# Patient Record
Sex: Male | Born: 1990 | Race: White | Hispanic: Yes | Marital: Single | Smoking: Former smoker
Health system: Southern US, Community
[De-identification: ages and names within clinical notes are randomized; demographics above are authoritative.]

## PROBLEM LIST (undated history)

## (undated) DIAGNOSIS — K760 Fatty (change of) liver, not elsewhere classified: Secondary | ICD-10-CM

## (undated) DIAGNOSIS — F1010 Alcohol abuse, uncomplicated: Secondary | ICD-10-CM

## (undated) DIAGNOSIS — E559 Vitamin D deficiency, unspecified: Secondary | ICD-10-CM

## (undated) HISTORY — PX: APPENDECTOMY: SHX54

---

## 2015-03-10 ENCOUNTER — Ambulatory Visit: Payer: Self-pay | Admitting: Physician Assistant

## 2015-05-19 ENCOUNTER — Ambulatory Visit
Admission: EM | Admit: 2015-05-19 | Discharge: 2015-05-19 | Disposition: A | Discharge status: Home / Self Care | Payer: PRIVATE HEALTH INSURANCE | Attending: Family Medicine | Admitting: Family Medicine

## 2015-05-19 DIAGNOSIS — K529 Noninfective gastroenteritis and colitis, unspecified: Secondary | ICD-10-CM

## 2015-05-19 MED ORDER — ONDANSETRON 8 MG PO TBDP: 8.0000 mg | ORAL_TABLET | Freq: Two times a day (BID) | ORAL | Status: DC

## 2015-05-19 MED ORDER — ONDANSETRON 8 MG PO TBDP
8.0000 mg | ORAL_TABLET | Freq: Once | ORAL | Status: AC
  Administered 2015-05-19: 8 mg via ORAL

## 2015-05-19 NOTE — ED Notes (Signed)
C.o nausea and vomiting x 2 days. Vomited 4 times yesterday and twice today. No blood in emesis. Still feels nauseous right now. Generalized intermittent abdominal pain . No diarrhea.

## 2015-05-19 NOTE — ED Provider Notes (Signed)
CSN: [id number]     Arrival date & time 05/19/15  1725 History   First MD Initiated Contact with Patient 05/19/15 1845     Chief Complaint  Patient presents with  . Nausea  . Emesis   (Consider location/radiation/quality/duration/timing/severity/associated sxs/prior Treatment) Patient is a 24 y.o. male presenting with vomiting. The history is provided by the patient.  Emesis Severity:  Mild Duration:  2 days Timing:  Intermittent Number of daily episodes:  4 Quality:  Stomach contents Able to tolerate:  Liquids (water and ginger ale) Progression:  Unchanged Chronicity:  New Associated symptoms: diarrhea   Associated symptoms: no abdominal pain, no chills and no fever   Diarrhea:    Quality:  Watery   Number of occurrences:  5   Severity:  Mild   Duration:  2 days   Timing:  Intermittent   Progression:  Unchanged Risk factors: sick contacts (co-worker with similar symptoms last week)   Risk factors: no suspect food intake and no travel to endemic areas     No past medical history on file. Past Surgical History  Procedure Laterality Date  . Appendectomy     No family history on file. History  Substance Use Topics  . Smoking status: Current Every Day Smoker    Types: Cigarettes  . Smokeless tobacco: Not on file  . Alcohol Use: Yes    Review of Systems  Constitutional: Negative for chills.  Gastrointestinal: Positive for vomiting and diarrhea. Negative for abdominal pain.    Allergies  Review of patient's allergies indicates not on file.  Home Medications   Prior to Admission medications   Medication Sig Start Date End Date Taking? Authorizing Provider  ondansetron (ZOFRAN ODT) 8 MG disintegrating tablet Take 1 tablet (8 mg total) by mouth 2 (two) times daily. 05/19/15   Payton Mccallumrlando Kelwin Gibler, MD   BP 148/98 mmHg  Pulse 87  Temp(Src) 97.9 F (36.6 C) (Oral)  Resp 18  Ht 5\' 7"  (1.702 m)  Wt 245 lb (111.131 kg)  BMI 38.36 kg/m2  SpO2 98% Physical Exam   Constitutional: He appears well-developed and well-nourished. No distress.  Neck: Neck supple.  Cardiovascular: Normal rate, regular rhythm and normal heart sounds.   Pulmonary/Chest: Effort normal and breath sounds normal. No respiratory distress. He has no wheezes. He has no rales.  Abdominal: Soft. Bowel sounds are normal. He exhibits no distension and no mass. There is tenderness (mild epigastric tenderness; no rebound or guarding). There is no rebound and no guarding.  Neurological: He is alert.  Skin: Skin is warm. He is not diaphoretic.  Nursing note and vitals reviewed.   ED Course  Procedures (including critical care time) Labs Review Labs Reviewed - No data to display  Imaging Review No results found.   MDM   1. Acute gastroenteritis    Discharge Medication List as of 05/19/2015  7:38 PM    START taking these medications   Details  ondansetron (ZOFRAN ODT) 8 MG disintegrating tablet Take 1 tablet (8 mg total) by mouth 2 (two) times daily., Starting 05/19/2015, Until Discontinued, Normal      Plan: 1.Diagnosis reviewed with patient 2. rx as per orders; risks, benefits, potential side effects reviewed with patient 3. Recommend supportive treatment with clear liquids then advance slowly as tolerated 4. F/u prn if symptoms worsen or don't improve    Payton Mccallumrlando Niki Payment, MD 05/19/15 1940

## 2015-07-06 ENCOUNTER — Emergency Department: Payer: PRIVATE HEALTH INSURANCE

## 2015-07-06 ENCOUNTER — Encounter: Payer: Self-pay | Admitting: Emergency Medicine

## 2015-07-06 ENCOUNTER — Emergency Department
Admission: EM | Admit: 2015-07-06 | Discharge: 2015-07-06 | Disposition: A | Discharge status: Home / Self Care | Payer: PRIVATE HEALTH INSURANCE | Attending: Emergency Medicine | Admitting: Emergency Medicine

## 2015-07-06 ENCOUNTER — Other Ambulatory Visit: Payer: Self-pay

## 2015-07-06 DIAGNOSIS — R110 Nausea: Secondary | ICD-10-CM

## 2015-07-06 DIAGNOSIS — Z720 Tobacco use: Secondary | ICD-10-CM | POA: Diagnosis not present

## 2015-07-06 DIAGNOSIS — Z79899 Other long term (current) drug therapy: Secondary | ICD-10-CM | POA: Insufficient documentation

## 2015-07-06 DIAGNOSIS — R42 Dizziness and giddiness: Secondary | ICD-10-CM | POA: Diagnosis not present

## 2015-07-06 DIAGNOSIS — R112 Nausea with vomiting, unspecified: Secondary | ICD-10-CM | POA: Diagnosis not present

## 2015-07-06 LAB — URINALYSIS COMPLETE WITH MICROSCOPIC (ARMC ONLY)
BACTERIA UA: NONE SEEN
Bilirubin Urine: NEGATIVE
GLUCOSE, UA: NEGATIVE mg/dL
HGB URINE DIPSTICK: NEGATIVE
KETONES UR: NEGATIVE mg/dL
Leukocytes, UA: NEGATIVE
Nitrite: NEGATIVE
PH: 7 (ref 5.0–8.0)
Protein, ur: 30 mg/dL — AB
RBC / HPF: NONE SEEN RBC/hpf (ref 0–5)
Specific Gravity, Urine: 1.026 (ref 1.005–1.030)
Squamous Epithelial / LPF: NONE SEEN

## 2015-07-06 LAB — CBC WITH DIFFERENTIAL/PLATELET
BASOS ABS: 0.1 10*3/uL (ref 0–0.1)
BASOS PCT: 2 %
EOS ABS: 0.3 10*3/uL (ref 0–0.7)
EOS PCT: 6 %
HCT: 48 % (ref 40.0–52.0)
Hemoglobin: 16 g/dL (ref 13.0–18.0)
Lymphocytes Relative: 39 %
Lymphs Abs: 1.7 10*3/uL (ref 1.0–3.6)
MCH: 31.4 pg (ref 26.0–34.0)
MCHC: 33.4 g/dL (ref 32.0–36.0)
MCV: 93.9 fL (ref 80.0–100.0)
Monocytes Absolute: 0.4 10*3/uL (ref 0.2–1.0)
Monocytes Relative: 10 %
Neutro Abs: 1.9 10*3/uL (ref 1.4–6.5)
Neutrophils Relative %: 43 %
Platelets: 249 10*3/uL (ref 150–440)
RBC: 5.11 MIL/uL (ref 4.40–5.90)
RDW: 13.1 % (ref 11.5–14.5)
WBC: 4.4 10*3/uL (ref 3.8–10.6)

## 2015-07-06 LAB — BASIC METABOLIC PANEL
ANION GAP: 12 (ref 5–15)
BUN: 14 mg/dL (ref 6–20)
CO2: 27 mmol/L (ref 22–32)
CREATININE: 0.76 mg/dL (ref 0.61–1.24)
Calcium: 8.9 mg/dL (ref 8.9–10.3)
Chloride: 102 mmol/L (ref 101–111)
GFR calc Af Amer: >60 mL/min (ref >60)
GFR calc non Af Amer: >60 mL/min (ref >60)
Glucose, Bld: 137 mg/dL — ABNORMAL HIGH (ref 65–99)
Potassium: 3.6 mmol/L (ref 3.5–5.1)
Sodium: 141 mmol/L (ref 135–145)

## 2015-07-06 LAB — HEPATIC FUNCTION PANEL
ALBUMIN: 4.4 g/dL (ref 3.5–5.0)
ALT: 68 U/L — ABNORMAL HIGH (ref 17–63)
AST: 64 U/L — ABNORMAL HIGH (ref 15–41)
Alkaline Phosphatase: 59 U/L (ref 38–126)
BILIRUBIN INDIRECT: 0.4 mg/dL (ref 0.3–0.9)
Bilirubin, Direct: 0.1 mg/dL (ref 0.1–0.5)
Total Bilirubin: 0.5 mg/dL (ref 0.3–1.2)
Total Protein: 7.6 g/dL (ref 6.5–8.1)

## 2015-07-06 LAB — TROPONIN I: Troponin I: <0.03 ng/mL (ref <0.031)

## 2015-07-06 LAB — LIPASE, BLOOD: Lipase: 31 U/L (ref 22–51)

## 2015-07-06 MED ORDER — PROMETHAZINE HCL 25 MG/ML IJ SOLN
25.0000 mg | Freq: Once | INTRAMUSCULAR | Status: AC
  Administered 2015-07-06: 25 mg via INTRAVENOUS

## 2015-07-06 MED ORDER — PROMETHAZINE HCL 25 MG/ML IJ SOLN
INTRAMUSCULAR | Status: AC
  Filled 2015-07-06: qty 1

## 2015-07-06 MED ORDER — PROMETHAZINE HCL 25 MG PO TABS: 25.0000 mg | ORAL_TABLET | Freq: Four times a day (QID) | ORAL | Status: DC | PRN

## 2015-07-06 MED ORDER — ASPIRIN 81 MG PO CHEW
CHEWABLE_TABLET | ORAL | Status: AC
  Filled 2015-07-06: qty 4

## 2015-07-06 MED ORDER — KETOROLAC TROMETHAMINE 30 MG/ML IJ SOLN
INTRAMUSCULAR | Status: AC
  Filled 2015-07-06: qty 1

## 2015-07-06 MED ORDER — SODIUM CHLORIDE 0.9 % IV BOLUS (SEPSIS)
1000.0000 mL | Freq: Once | INTRAVENOUS | Status: AC
  Administered 2015-07-06: 1000 mL via INTRAVENOUS

## 2015-07-06 MED ORDER — ONDANSETRON HCL 4 MG/2ML IJ SOLN
INTRAMUSCULAR | Status: AC
  Filled 2015-07-06: qty 2

## 2015-07-06 MED ORDER — SODIUM CHLORIDE 0.9 % IV SOLN
1000.0000 mL | Freq: Once | INTRAVENOUS | Status: AC
  Administered 2015-07-06: 1000 mL via INTRAVENOUS

## 2015-07-06 MED ORDER — ONDANSETRON HCL 4 MG/2ML IJ SOLN
4.0000 mg | Freq: Once | INTRAMUSCULAR | Status: AC
  Administered 2015-07-06: 4 mg via INTRAVENOUS

## 2015-07-06 NOTE — Discharge Instructions (Signed)
Nausea and Vomiting °Nausea is a sick feeling that often comes before throwing up (vomiting). Vomiting is a reflex where stomach contents come out of your mouth. Vomiting can cause severe loss of body fluids (dehydration). Children and elderly adults can become dehydrated quickly, especially if they also have diarrhea. Nausea and vomiting are symptoms of a condition or disease. It is important to find the cause of your symptoms. °CAUSES  °· Direct irritation of the stomach lining. This irritation can result from increased acid production (gastroesophageal reflux disease), infection, food poisoning, taking certain medicines (such as nonsteroidal anti-inflammatory drugs), alcohol use, or tobacco use. °· Signals from the brain. These signals could be caused by a headache, heat exposure, an inner ear disturbance, increased pressure in the brain from injury, infection, a tumor, or a concussion, pain, emotional stimulus, or metabolic problems. °· An obstruction in the gastrointestinal tract (bowel obstruction). °· Illnesses such as diabetes, hepatitis, gallbladder problems, appendicitis, kidney problems, cancer, sepsis, atypical symptoms of a heart attack, or eating disorders. °· Medical treatments such as chemotherapy and radiation. °· Receiving medicine that makes you sleep (general anesthetic) during surgery. °DIAGNOSIS °Your caregiver may ask for tests to be done if the problems do not improve after a few days. Tests may also be done if symptoms are severe or if the reason for the nausea and vomiting is not clear. Tests may include: °· Urine tests. °· Blood tests. °· Stool tests. °· Cultures (to look for evidence of infection). °· X-rays or other imaging studies. °Test results can help your caregiver make decisions about treatment or the need for additional tests. °TREATMENT °You need to stay well hydrated. Drink frequently but in small amounts. You may wish to drink water, sports drinks, clear broth, or eat frozen  ice pops or gelatin dessert to help stay hydrated. When you eat, eating slowly may help prevent nausea. There are also some antinausea medicines that may help prevent nausea. °HOME CARE INSTRUCTIONS  °· Take all medicine as directed by your caregiver. °· If you do not have an appetite, do not force yourself to eat. However, you must continue to drink fluids. °· If you have an appetite, eat a normal diet unless your caregiver tells you differently. °¨ Eat a variety of complex carbohydrates (rice, wheat, potatoes, bread), lean meats, yogurt, fruits, and vegetables. °¨ Avoid high-fat foods because they are more difficult to digest. °· Drink enough water and fluids to keep your urine clear or pale yellow. °· If you are dehydrated, ask your caregiver for specific rehydration instructions. Signs of dehydration may include: °¨ Severe thirst. °¨ Dry lips and mouth. °¨ Dizziness. °¨ Dark urine. °¨ Decreasing urine frequency and amount. °¨ Confusion. °¨ Rapid breathing or pulse. °SEEK IMMEDIATE MEDICAL CARE IF:  °· You have blood or brown flecks (like coffee grounds) in your vomit. °· You have black or bloody stools. °· You have a severe headache or stiff neck. °· You are confused. °· You have severe abdominal pain. °· You have chest pain or trouble breathing. °· You do not urinate at least once every 8 hours. °· You develop cold or clammy skin. °· You continue to vomit for longer than 24 to 48 hours. °· You have a fever. °MAKE SURE YOU:  °· Understand these instructions. °· Will watch your condition. °· Will get help right away if you are not doing well or get worse. °Document Released: 12/13/2005 Document Revised: 03/06/2012 Document Reviewed: 05/12/2011 °ExitCare® Patient Information ©2015 [employer]. This information is not intended   to replace advice given to you by your health care provider. Make sure you discuss any questions you have with your health care provider.    Please take nausea medication as needed, as  written. Please follow up their primary care doctor this week for recheck. Please drink plenty of fluids of the next several days. Return to the emergency department for any abdominal pain, chest pain, fever, or any other symptom personally concerning to yourself.  As we have discussed please call the number provided for cardiology to arrange a follow-up appointment regarding today's EKG. Again this does not appear to be a concerning finding, this should be further evaluated in the near future. A copy of her EKG has been provided, please bring this to cardiology with you.

## 2015-07-06 NOTE — ED Provider Notes (Addendum)
[facility] Emergency Department Provider Note  Time seen: 7:22 AM  I have reviewed the triage vital signs and the nursing notes.   HISTORY  Chief Complaint Emesis and Dizziness    HPI Luis Richards is a 24 y.o. male with no past medical history who presents to the emergency department with nausea and vomiting 3 days. According to the patient he has been having frequent vomiting, unable to keep down fluids so came to the emergency department. Denies diarrhea, bloody stool or vomit, fever, dysuria, abdominal pain. Patient states constant nausea, but denies other symptoms. Describes his nausea as severe.     History reviewed. No pertinent past medical history.  There are no active problems to display for this patient.   Past Surgical History  Procedure Laterality Date  . Appendectomy      Current Outpatient Rx  Name  Route  Sig  Dispense  Refill  . ondansetron (ZOFRAN ODT) 8 MG disintegrating tablet   Oral   Take 1 tablet (8 mg total) by mouth 2 (two) times daily.   6 tablet   0     Allergies Review of patient's allergies indicates no known allergies.  History reviewed. No pertinent family history.  Social History History  Substance Use Topics  . Smoking status: Current Every Day Smoker    Types: Cigarettes  . Smokeless tobacco: Never Used  . Alcohol Use: Yes     Comment: occasional use    Review of Systems Constitutional: Negative for fever. Cardiovascular: Negative for chest pain. Respiratory: Negative for shortness of breath. Gastrointestinal: Negative for abdominal pain. Positive for nausea Genitourinary: Negative for dysuria. Neurological: Negative for headache  10-point ROS otherwise negative.  ____________________________________________   PHYSICAL EXAM:  VITAL SIGNS: ED Triage Vitals  Enc Vitals Group     BP 07/06/15 0649 133/96 mmHg     Pulse Rate 07/06/15 0649 100     Resp 07/06/15 0649 18     Temp  07/06/15 0649 98.3 F (36.8 C)     Temp Source 07/06/15 0649 Oral     SpO2 07/06/15 0649 97 %     Weight 07/06/15 0649 250 lb (113.399 kg)     Height 07/06/15 0649  (1.702 m)     Head Cir --      Peak Flow --      Pain Score 07/06/15 0650 7     Pain Loc --      Pain Edu? --      Excl. in GC? --     Constitutional: Alert and oriented. Well appearing and in no distress. ENT   Mouth/Throat: Mucous membranes are moist. Cardiovascular: Normal rate, regular rhythm. No murmur Respiratory: Normal respiratory effort without tachypnea nor retractions. Breath sounds are clear and equal bilaterally.  Gastrointestinal: Soft, mild epigastric tenderness to palpation. No rebound or guarding. No distention. Musculoskeletal: Nontender with normal range of motion in all extremities.  Neurologic:  Normal speech and language. No gross focal neurologic deficits  Skin:  Skin is warm, dry and intact.  Psychiatric: Mood and affect are normal. Speech and behavior are normal.   ____________________________________________    INITIAL IMPRESSION / ASSESSMENT AND PLAN / ED COURSE  Pertinent labs & imaging results that were available during my care of the patient were reviewed by me and considered in my medical decision making (see chart for details).  Patient with persistent nausea and vomiting 3 days. Denies any pain symptoms. Denies diarrhea, fever. We will  check labs, treat nausea, IV hydrate while awaiting lab results.  Labs have resulted showing normal results. Patient states he feels much better after Zofran. We will continue with IV hydration, and discharge the patient home on Phenergan as he was unable to afford Zofran the last time he was prescribed.   EKG reviewed and interpreted by myself shows normal sinus rhythm at 86 bpm, narrow QRS, normal axis, normal intervals, nonspecific ST changes present. No ST elevations noted. 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 No QRSs originate from this ectopic foci. Does not appear to be of a concerning nature, but I will recommend the patient follow up with cardiology for further evaluation.  Given mild LFT elevation, ultrasound was performed which shows fatty infiltrates in the liver, otherwise within normal limits. We will discharge the patient on Phenergan with primary care follow-up and cardiology follow-up I discussed this with the patient is agreeable.  ____________________________________________   FINAL CLINICAL IMPRESSION(S) / ED DIAGNOSES  Nausea and vomiting   Minna AntisKevin Avina Eberle, MD 07/06/15 [id number]  Minna AntisKevin Junior Huezo, MD 07/06/15 [id number]  Minna AntisKevin Kamorie Aldous, MD 07/06/15 1054

## 2015-07-06 NOTE — ED Notes (Signed)
Pt discharged home after verbalizing understanding of discharge instructions; nad noted. 

## 2015-07-06 NOTE — ED Notes (Addendum)
Pt c/o vomiting since Thursday; unable to keep anything down; last vomited just pta; generalized abd pain; denies diarrhea; last BM was yesterday-normal; pt pale in triage; pt seen here the end of May for similar symptoms; was prescribed Zofran but due to finances that was never filled

## 2015-07-08 ENCOUNTER — Ambulatory Visit
Admission: EM | Admit: 2015-07-08 | Discharge: 2015-07-08 | Disposition: A | Discharge status: Home / Self Care | Payer: PRIVATE HEALTH INSURANCE | Attending: Family Medicine | Admitting: Family Medicine

## 2015-07-08 DIAGNOSIS — K529 Noninfective gastroenteritis and colitis, unspecified: Secondary | ICD-10-CM | POA: Diagnosis not present

## 2015-07-08 MED ORDER — ONDANSETRON 8 MG PO TBDP: 8.0000 mg | ORAL_TABLET | Freq: Four times a day (QID) | ORAL | Status: DC | PRN

## 2015-07-08 MED ORDER — ONDANSETRON 8 MG PO TBDP: 8.0000 mg | ORAL_TABLET | Freq: Two times a day (BID) | ORAL | Status: DC

## 2015-07-08 MED ORDER — ONDANSETRON 8 MG PO TBDP
8.0000 mg | ORAL_TABLET | Freq: Once | ORAL | Status: AC
  Administered 2015-07-08: 8 mg via ORAL

## 2015-07-08 NOTE — ED Notes (Signed)
Pt states "I have been vomiting since last Thursday, I went to ED on Sunday and was diagnosed with stomach bug, it is still here."

## 2015-07-08 NOTE — ED Notes (Signed)
Pt tolerating Pedialyte without problem. Card given for Dr. Hollace HaywardPlonk as patient needs to establish with a PCP.

## 2015-07-08 NOTE — ED Provider Notes (Signed)
CSN: [id number]     Arrival date & time 07/08/15  1620 History   First MD Initiated Contact with Patient 07/08/15 1648     Chief Complaint  Patient presents with  . Emesis  . Diarrhea  . Back Pain   (Consider location/radiation/quality/duration/timing/severity/associated sxs/prior Treatment) HPI  Patient was seen in the ED two days ago and discharged with a diagnosis of gastroenteritis. He was prescribed Phenergan, but states the medication is making him too sleepy, so he has not been taking it. He presents for reevaluation today as his symptoms have been present for 5 days without improvement. He denies any blood in his stools or vomit. He denies any fever or chills. He has tried drinking clear liquids, but is unable to keep anything down. This is the third episode of similar symptoms in the past few months.  History reviewed. No pertinent past medical history. Past Surgical History  Procedure Laterality Date  . Appendectomy     History reviewed. No pertinent family history. History  Substance Use Topics  . Smoking status: Current Every Day Smoker    Types: Cigarettes  . Smokeless tobacco: Never Used  . Alcohol Use: Yes     Comment: occasional use    Review of Systems  Constitutional: Positive for appetite change and fatigue. Negative for fever and chills.  Respiratory: Negative for shortness of breath.   Cardiovascular: Negative for chest pain.  Gastrointestinal: Positive for nausea, vomiting and diarrhea.  Musculoskeletal: Negative for myalgias and arthralgias.  Skin: Positive for pallor.  Neurological: Negative for light-headedness and headaches.    Allergies  Review of patient's allergies indicates no known allergies.  Home Medications   Prior to Admission medications   Medication Sig Start Date End Date Taking? Authorizing Provider  ondansetron (ZOFRAN ODT) 8 MG disintegrating tablet Take 1 tablet (8 mg total) by mouth 2 (two) times daily. 05/19/15  Yes Norval Gable, MD  promethazine (PHENERGAN) 25 MG tablet Take 1 tablet (25 mg total) by mouth every 6 (six) hours as needed for nausea or vomiting. 07/06/15  Yes Harvest Dark, MD   BP 122/85 mmHg  Temp(Src) 98.4 F (36.9 C) (Tympanic)  Resp 16  Ht _0  (1.702 m)  Wt 250 lb (113.399 kg)  BMI 39.15 kg/m2 Physical Exam  Constitutional: He is oriented to person, place, and time. He appears well-developed and well-nourished.  Cardiovascular: Normal rate and regular rhythm.   Pulmonary/Chest: Effort normal and breath sounds normal.  Abdominal: He exhibits no abdominal bruit. Bowel sounds are absent. There is tenderness in the right upper quadrant, epigastric area and suprapubic area.  Neurological: He is alert and oriented to person, place, and time.    ED Course  Procedures (including critical care time) Labs Review Labs Reviewed - No data to display  Imaging Review No results found.   Patient was given Zofran ODT 31m in clinic and reported significant improvement in his nausea. He was able to tolerate a po challenge of Pedialyte without difficulty.   MDM   1. Gastroenteritis, acute    - Prescribed Zofran ODT 873m- Patient was given a stool kit to take home and collect a stool sample to test for C. Difficile, Ovas and Parasites, and obtain a stool culture - He will establish care with a PCP - Follow-up as needed.    NiMathews ArgylePA-C 07/08/15 1746

## 2015-07-08 NOTE — Discharge Instructions (Signed)
Gastritis, Adult Gastritis is soreness and puffiness (inflammation) of the lining of the stomach. If you do not get help, gastritis can cause bleeding and sores (ulcers) in the stomach. HOME CARE   Only take medicine as told by your doctor.  If you were given antibiotic medicines, take them as told. Finish the medicines even if you start to feel better.  Drink enough fluids to keep your pee (urine) clear or pale yellow.  Avoid foods and drinks that make your problems worse. Foods you may want to avoid include:  Caffeine or alcohol.  Chocolate.  Mint.  Garlic and onions.  Spicy foods.  Citrus fruits, including oranges, lemons, or limes.  Food containing tomatoes, including sauce, chili, salsa, and pizza.  Fried and fatty foods.  Eat small meals throughout the day instead of large meals. GET HELP RIGHT AWAY IF:   You have black or dark red poop (stools).  You throw up (vomit) blood. It may look like coffee grounds.  You cannot keep fluids down.  Your belly (abdominal) pain gets worse.  You have a fever.  You do not feel better after 1 week.  You have any other questions or concerns. MAKE SURE YOU:   Understand these instructions.  Will watch your condition.  Will get help right away if you are not doing well or get worse. Document Released: 05/31/2008 Document Revised: 03/06/2012 Document Reviewed: 01/26/2012 [facility] Patient Information 2015 [place], [place]. This information is not intended to replace advice given to you by your health care provider. Make sure you discuss any questions you have with your health care provider.   Once stool sample is collected[facility] to begin testing. Diet should consist of bland foods, such as Bananas, Rice, Applesauce, and Toast until symptoms resolve. Continue rehydrating with Pedialyte until symptoms resolve.

## 2015-07-21 ENCOUNTER — Encounter: Payer: Self-pay | Admitting: Family Medicine

## 2015-07-21 ENCOUNTER — Ambulatory Visit (INDEPENDENT_AMBULATORY_CARE_PROVIDER_SITE_OTHER): Payer: Self-pay | Admitting: Family Medicine

## 2015-07-21 VITALS — BP 120/90 | HR 80 | Ht 66.0 in | Wt 245.0 lb

## 2015-07-21 DIAGNOSIS — F17200 Nicotine dependence, unspecified, uncomplicated: Secondary | ICD-10-CM

## 2015-07-21 DIAGNOSIS — Z720 Tobacco use: Secondary | ICD-10-CM

## 2015-07-21 DIAGNOSIS — R112 Nausea with vomiting, unspecified: Secondary | ICD-10-CM

## 2015-07-21 DIAGNOSIS — R809 Proteinuria, unspecified: Secondary | ICD-10-CM

## 2015-07-21 DIAGNOSIS — R739 Hyperglycemia, unspecified: Secondary | ICD-10-CM

## 2015-07-21 DIAGNOSIS — R51 Headache: Secondary | ICD-10-CM

## 2015-07-21 DIAGNOSIS — K760 Fatty (change of) liver, not elsewhere classified: Secondary | ICD-10-CM

## 2015-07-21 DIAGNOSIS — R519 Headache, unspecified: Secondary | ICD-10-CM

## 2015-07-21 DIAGNOSIS — E669 Obesity, unspecified: Secondary | ICD-10-CM

## 2015-07-21 LAB — POCT URINALYSIS DIPSTICK
Bilirubin, UA: NEGATIVE
Blood, UA: NEGATIVE
GLUCOSE UA: NEGATIVE
Ketones, UA: NEGATIVE
Leukocytes, UA: NEGATIVE
NITRITE UA: NEGATIVE
PH UA: 5
Protein, UA: NEGATIVE
SPEC GRAV UA: 1.02
Urobilinogen, UA: 0.2

## 2015-07-21 LAB — GLUCOSE, POCT (MANUAL RESULT ENTRY): POC GLUCOSE: 124 mg/dL — AB (ref 70–99)

## 2015-07-21 MED ORDER — HALOPERIDOL 0.5 MG PO TABS: 0.5000 mg | ORAL_TABLET | Freq: Four times a day (QID) | ORAL | Status: DC | PRN

## 2015-07-21 MED ORDER — TRAMADOL HCL 50 MG PO TABS: 50.0000 mg | ORAL_TABLET | Freq: Four times a day (QID) | ORAL | Status: DC | PRN

## 2015-07-22 DIAGNOSIS — F17200 Nicotine dependence, unspecified, uncomplicated: Secondary | ICD-10-CM | POA: Insufficient documentation

## 2015-07-22 DIAGNOSIS — K760 Fatty (change of) liver, not elsewhere classified: Secondary | ICD-10-CM | POA: Insufficient documentation

## 2015-07-22 DIAGNOSIS — E669 Obesity, unspecified: Secondary | ICD-10-CM | POA: Insufficient documentation

## 2015-07-22 LAB — COMPREHENSIVE METABOLIC PANEL
A/G RATIO: 1.8 (ref 1.1–2.5)
ALT: 57 IU/L — ABNORMAL HIGH (ref 0–44)
AST: 58 IU/L — ABNORMAL HIGH (ref 0–40)
Albumin: 4.5 g/dL (ref 3.5–5.5)
Alkaline Phosphatase: 94 IU/L (ref 39–117)
BUN/Creatinine Ratio: 23 — ABNORMAL HIGH (ref 8–19)
BUN: 14 mg/dL (ref 6–20)
Bilirubin Total: 0.4 mg/dL (ref 0.0–1.2)
CO2: 23 mmol/L (ref 18–29)
CREATININE: 0.62 mg/dL — AB (ref 0.76–1.27)
Calcium: 9 mg/dL (ref 8.7–10.2)
Chloride: 95 mmol/L — ABNORMAL LOW (ref 97–108)
GFR, EST AFRICAN AMERICAN: 159 mL/min/{1.73_m2} (ref >59)
GFR, EST NON AFRICAN AMERICAN: 138 mL/min/{1.73_m2} (ref >59)
Globulin, Total: 2.5 g/dL (ref 1.5–4.5)
Glucose: 102 mg/dL — ABNORMAL HIGH (ref 65–99)
POTASSIUM: 3.8 mmol/L (ref 3.5–5.2)
Sodium: 139 mmol/L (ref 134–144)
Total Protein: 7 g/dL (ref 6.0–8.5)

## 2015-07-22 LAB — CBC
Hematocrit: 47.5 % (ref 37.5–51.0)
Hemoglobin: 16 g/dL (ref 12.6–17.7)
MCH: 32.1 pg (ref 26.6–33.0)
MCHC: 33.7 g/dL (ref 31.5–35.7)
MCV: 95 fL (ref 79–97)
PLATELETS: 188 10*3/uL (ref 150–379)
RBC: 4.98 x10E6/uL (ref 4.14–5.80)
RDW: 13.7 % (ref 12.3–15.4)
WBC: 4.5 10*3/uL (ref 3.4–10.8)

## 2015-07-22 NOTE — Progress Notes (Signed)
Date:  07/21/2015   Name:  Luis Richards   DOB:  10-01-1991   MRN:  [id number]  PCP:  Schuyler Amor, MD    Chief Complaint: Headache   History of Present Illness:  This is a 24 y.o. male with 2 wk hx of n/v, seen [facility] ED, told stomach flu, labs ok except glucose 137 and mild proteinuria, ALT/AST elevated, liver u/s showed fatty infiltrates only. Sxs unimproved, denies fever, has developed posterior HA past 5 days, Tylenol no help, Zofran/Phenergan also not helping much. Appetite poor but denies significant abdominal pain.   Review of Systems:  Review of Systems  Constitutional: Negative for fever and chills.  HENT: Negative for ear pain and sore throat.   Eyes: Negative for pain.  Respiratory: Negative for cough and shortness of breath.   Cardiovascular: Negative for chest pain and leg swelling.  Gastrointestinal: Positive for constipation. Negative for abdominal pain.  Endocrine: Negative for polyuria.  Genitourinary: Negative for dysuria and hematuria.  Musculoskeletal: Negative for back pain.  Skin: Negative for rash.  Neurological: Positive for dizziness and headaches. Negative for tremors and syncope.  Hematological: Negative for adenopathy.  Psychiatric/Behavioral: Negative for sleep disturbance and dysphoric mood.    Patient Active Problem List   Diagnosis Date Noted  . Fatty liver 07/22/2015  . Smoker 07/22/2015  . Obesity (BMI 30-39.9) 07/22/2015    Prior to Admission medications   Medication Sig Start Date End Date Taking? Authorizing Provider  haloperidol (HALDOL) 0.5 MG tablet Take 1 tablet (0.5 mg total) by mouth every 6 (six) hours as needed (for nausea). 07/21/15   Schuyler Amor, MD  ondansetron (ZOFRAN ODT) 8 MG disintegrating tablet Take 1 tablet (8 mg total) by mouth every 6 (six) hours as needed for nausea or vomiting. 07/08/15   Carlean Purl, PA-C  promethazine (PHENERGAN) 25 MG tablet Take 1 tablet (25 mg total) by mouth every 6 (six) hours as needed  for nausea or vomiting. 07/06/15   Minna Antis, MD  traMADol (ULTRAM) 50 MG tablet Take 1 tablet (50 mg total) by mouth every 6 (six) hours as needed for moderate pain. 07/21/15   Schuyler Amor, MD    No Known Allergies  Past Surgical History  Procedure Laterality Date  . Appendectomy      History  Substance Use Topics  . Smoking status: Current Every Day Smoker    Types: Cigarettes  . Smokeless tobacco: Not on file  . Alcohol Use: 0.0 oz/week    0 Standard drinks or equivalent per week     Comment: occasional use    Family History  Problem Relation Age of Onset  . Diabetes Father   . Diabetes Paternal Aunt   . Diabetes Paternal Uncle     Medication list has been reviewed and updated.  Physical Examination: BP 120/90 mmHg  Pulse 80  Ht 5\' 6"  (1.676 m)  Wt 245 lb (111.131 kg)  BMI 39.56 kg/m2  Physical Exam  Constitutional: He is oriented to person, place, and time. He appears well-developed and well-nourished. No distress.  Fatigued appearing  HENT:  Head: Normocephalic and atraumatic.  Right Ear: External ear normal.  Left Ear: External ear normal.  Mouth/Throat: Oropharynx is clear and moist.  No sinus tenderness  Eyes: Conjunctivae and EOM are normal. Pupils are equal, round, and reactive to light.  Neck: Normal range of motion. Neck supple. No thyromegaly present.  Cardiovascular: Normal rate, regular rhythm and normal heart sounds.   Pulmonary/Chest: Effort normal  and breath sounds normal.  Abdominal: Soft. He exhibits no distension and no mass. There is no tenderness.  Musculoskeletal: He exhibits no edema.  Lymphadenopathy:    He has no cervical adenopathy.  Neurological: He is alert and oriented to person, place, and time.  Skin: Skin is warm and dry. No rash noted.  Psychiatric: He has a normal mood and affect. His behavior is normal.    Assessment and Plan:  1. Acute intractable headache, unspecified headache type Unclear etiology, trial  tramadol - Comprehensive Metabolic Panel (CMET) - CBC  2. Nausea and vomiting, vomiting of unspecified type Unclear etiology, will repeat labs, push PO fluids, recheck in am - POCT urinalysis dipstick - Comprehensive Metabolic Panel (CMET) - CBC  3. Hyperglycemia FSBG 124 today - POCT Glucose (CBG)  4. Fatty liver Likely NASH but could be alcohol related (admits only occasional use)  5. Proteinuria Resolved on UA today  6. Smoker Discussed cessation  7. Obesity (BMI 30-39.9) Discussed weight loss/exercise  Return in about 1 day (around 07/22/2015).  Dionne Ano. Kingsley Spittle MD [facility]  07/22/2015

## 2016-03-24 ENCOUNTER — Ambulatory Visit (INDEPENDENT_AMBULATORY_CARE_PROVIDER_SITE_OTHER): Payer: BLUE CROSS/BLUE SHIELD | Admitting: Family Medicine

## 2016-03-24 ENCOUNTER — Encounter: Payer: Self-pay | Admitting: Family Medicine

## 2016-03-24 VITALS — BP 115/80 | HR 62 | Temp 98.3°F | Resp 16 | Ht 66.0 in | Wt 249.0 lb

## 2016-03-24 DIAGNOSIS — L219 Seborrheic dermatitis, unspecified: Secondary | ICD-10-CM

## 2016-03-24 DIAGNOSIS — K760 Fatty (change of) liver, not elsewhere classified: Secondary | ICD-10-CM | POA: Diagnosis not present

## 2016-03-24 DIAGNOSIS — E669 Obesity, unspecified: Secondary | ICD-10-CM | POA: Diagnosis not present

## 2016-03-24 DIAGNOSIS — Z720 Tobacco use: Secondary | ICD-10-CM | POA: Diagnosis not present

## 2016-03-24 DIAGNOSIS — E559 Vitamin D deficiency, unspecified: Secondary | ICD-10-CM

## 2016-03-24 DIAGNOSIS — F17200 Nicotine dependence, unspecified, uncomplicated: Secondary | ICD-10-CM

## 2016-03-24 MED ORDER — CICLOPIROX 1 % EX SHAM: 1.0000 "application " | MEDICATED_SHAMPOO | Freq: Every day | CUTANEOUS | Status: DC

## 2016-03-24 NOTE — Patient Instructions (Signed)
Try ciclopirox shampoo nightly until controlled then weekly as needed. May try [facility] or TGel shampoos if ineffective.

## 2016-03-24 NOTE — Progress Notes (Signed)
Date:  03/24/2016   Name:  Luis Richards   DOB:  1991/06/15   MRN:  [id number]  PCP:  Schuyler AmorWilliam Albie Arizpe, MD    Chief Complaint: Rash   History of Present Illness:  This is a 25 y.o. male for eval scalp rash he has had on and off since child, thinks eczema. Head and Shoulders and ketoconazole shampoos ineffective, uses HC cream prn on face which helps. Rash worse with sweating. Weight still an issue, has nutritionist at work he can see. Walks a lot at work but otherwise no regular exercise. Recently had blood work at work that showed normal LFT's but low vit D level, now on supplement. Still smoking. Headaches ok.  Review of Systems:  Review of Systems  Constitutional: Negative for fever and fatigue.  Respiratory: Negative for cough and shortness of breath.   Cardiovascular: Negative for chest pain and leg swelling.  Endocrine: Negative for polyuria.  Genitourinary: Negative for difficulty urinating.  Neurological: Negative for syncope and light-headedness.    Patient Active Problem List   Diagnosis Date Noted  . Vitamin D deficiency 03/24/2016  . Fatty liver 07/22/2015  . Smoker 07/22/2015  . Obesity (BMI 30-39.9) 07/22/2015    Prior to Admission medications   Medication Sig Start Date End Date Taking? Authorizing Provider  Ciclopirox 1 % shampoo Apply 1 application topically at bedtime. 03/24/16   Schuyler AmorWilliam Ashby Moskal, MD    No Known Allergies  Past Surgical History  Procedure Laterality Date  . Appendectomy      Social History  Substance Use Topics  . Smoking status: Current Every Day Smoker    Types: Cigarettes  . Smokeless tobacco: None  . Alcohol Use: 0.0 oz/week    0 Standard drinks or equivalent per week     Comment: occasional use    Family History  Problem Relation Age of Onset  . Diabetes Father   . Diabetes Paternal Aunt   . Diabetes Paternal Uncle     Medication list has been reviewed and updated.  Physical Examination: BP 115/80 mmHg  Pulse 62   Temp(Src) 98.3 F (36.8 C) (Oral)  Resp 16  Ht 5\' 6"  (1.676 m)  Wt 249 lb (112.946 kg)  BMI 40.21 kg/m2  SpO2 100%  Physical Exam  Constitutional: He appears well-developed and well-nourished.  Cardiovascular: Normal rate, regular rhythm and normal heart sounds.   Pulmonary/Chest: Effort normal and breath sounds normal.  Musculoskeletal: He exhibits no edema.  Neurological: He is alert.  Skin: Skin is warm and dry.  Mild erythematous rash over scalp and at hairline  Psychiatric: He has a normal mood and affect. His behavior is normal.  Nursing note and vitals reviewed.   Assessment and Plan:  1. Seborrheic dermatitis Ciclopirox shampoo nightly x 1 week then weekly, can also try Selsun Blue or TGel shampoos  2. Obesity (BMI 30-39.9) Will arrange to see nutritionist at work  3. Smoker Advised cessation  4. Vitamin D deficiency Continue supplement, consider recheck level next visit  5. Fatty liver Liver enzymes apparently ok at work, consider repeat LFT's and a1c next visit  Return in about 3 months (around 06/24/2016).  Dionne AnoWilliam M. Kingsley SpittlePlonk, Jr. MD [facility]  03/24/2016

## 2016-07-09 IMAGING — US US ABDOMEN LIMITED
1 series · 14 of 25 positions shown · non-contrast
Comparison: None.

CLINICAL DATA: Three day history of nausea and diffuse abdominal
pain.

EXAM:
US ABDOMEN LIMITED - RIGHT UPPER QUADRANT

[Series 1: us abdomen limited · 0.24mm/px · 14 of 56 slices shown]
[im 1/56]
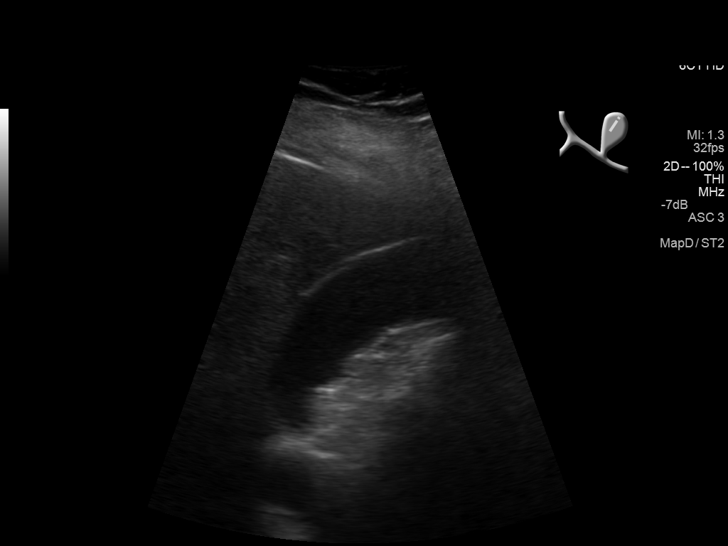
[im 5/56]
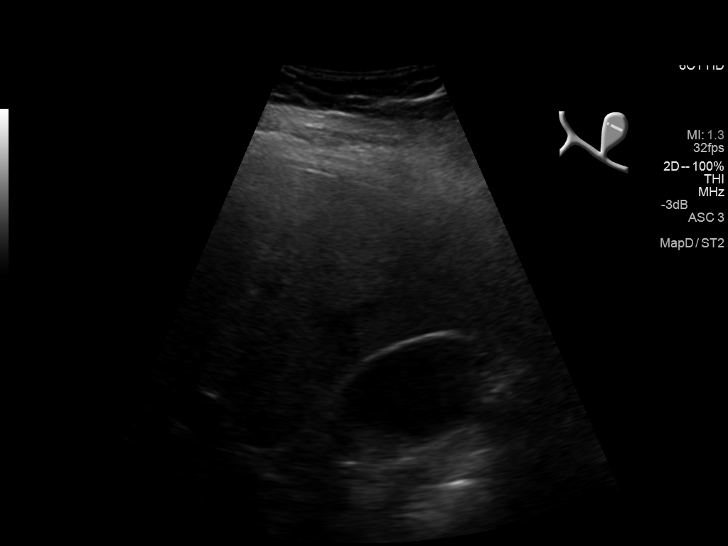
[im 10/56]
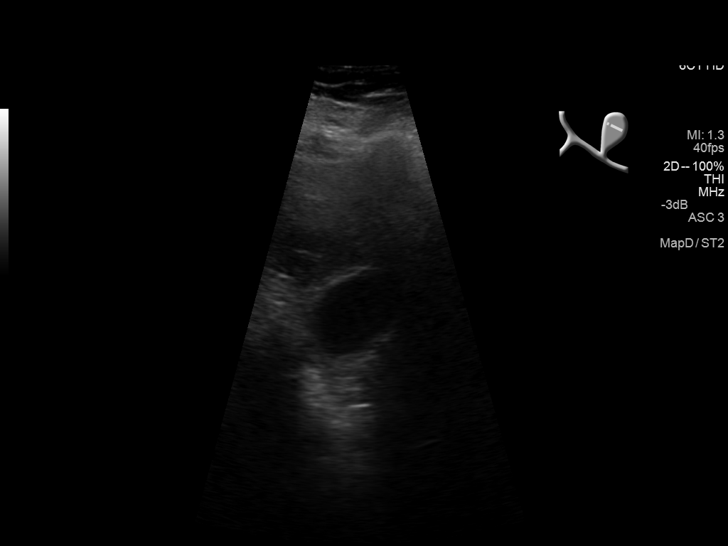
[im 14/56]
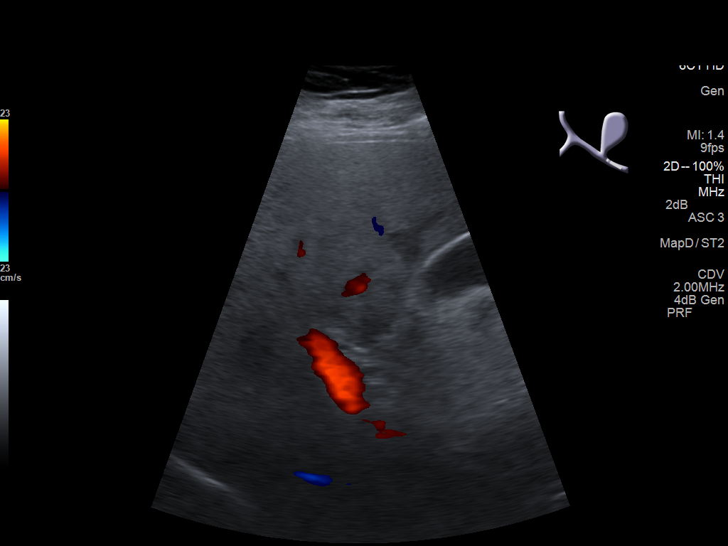
[im 19/56]
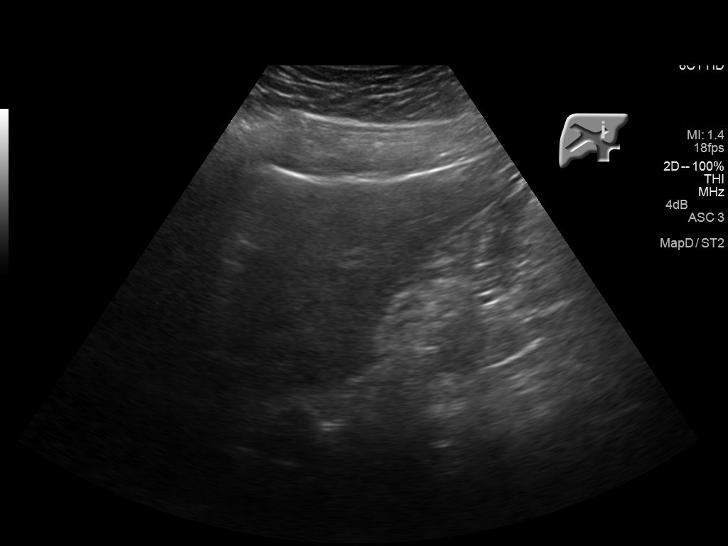
[im 21/56]
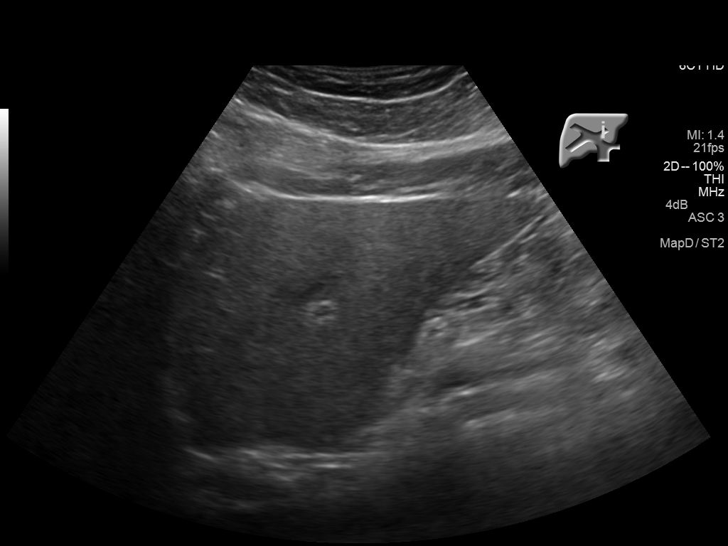
[im 26/56]
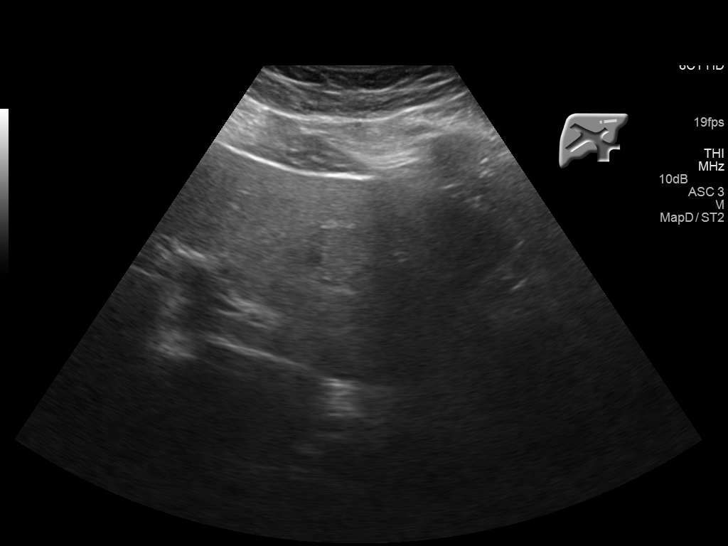
[im 30/56]
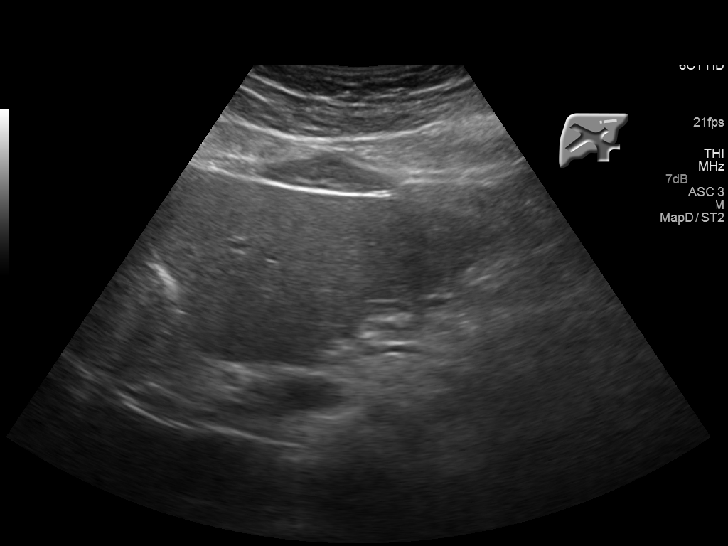
[im 35/56]
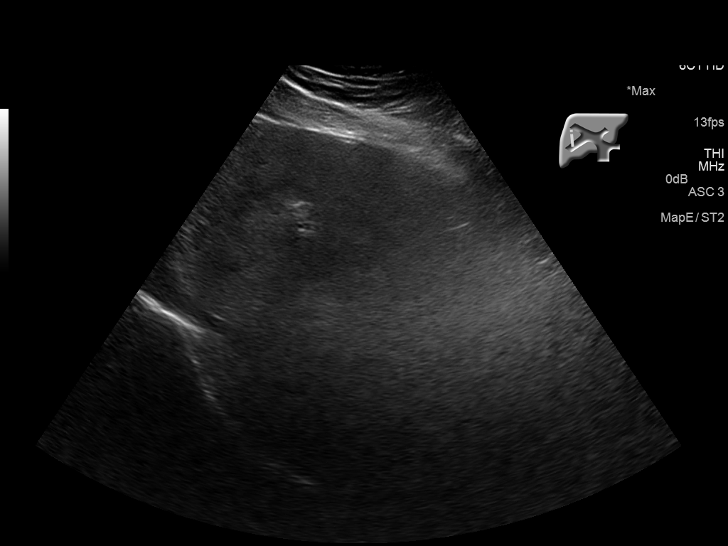
[im 37/56]
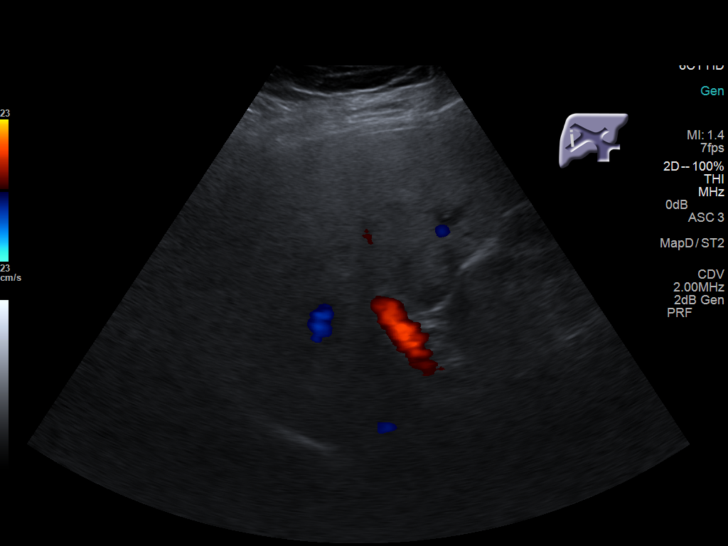
[im 42/56]
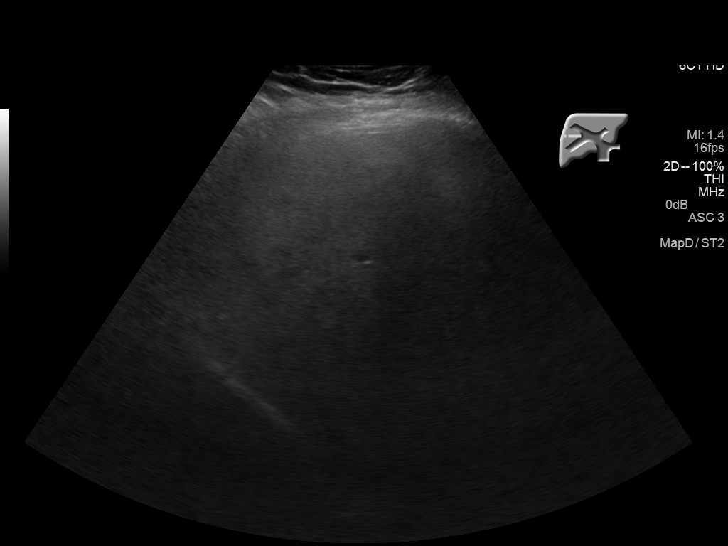
[im 46/56]
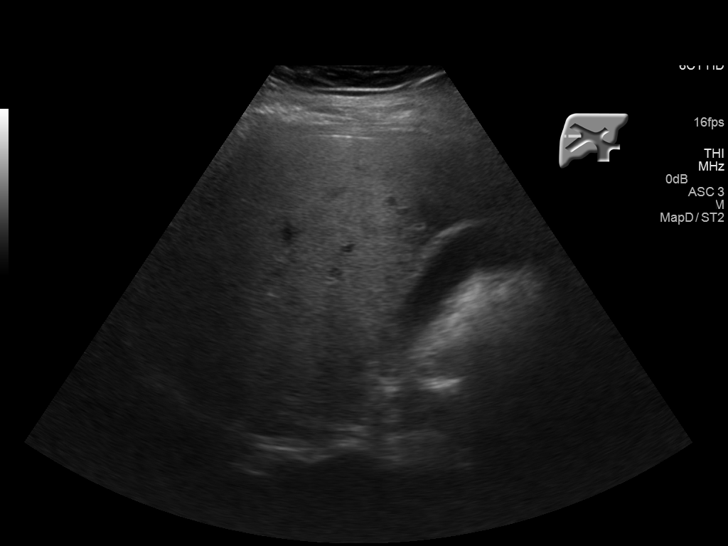
[im 51/56]
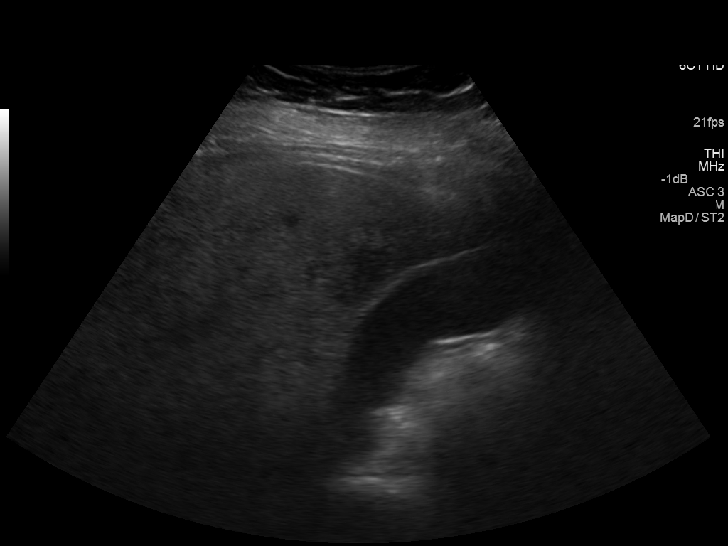
[im 56/56]
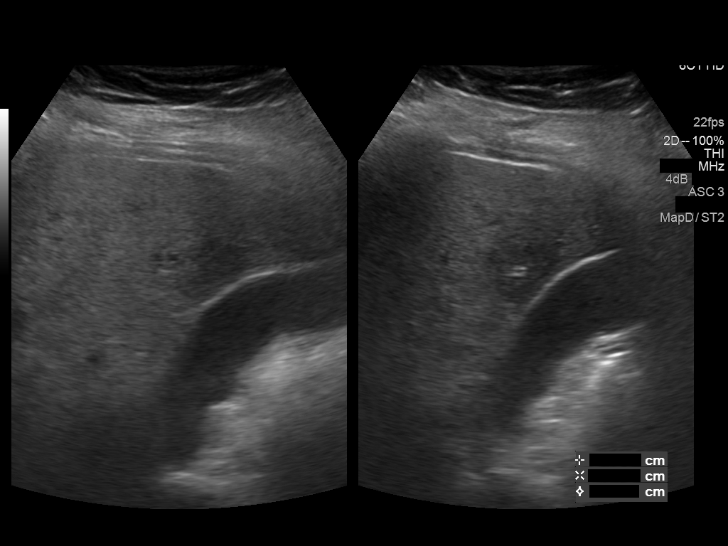

[14 of 25 positions shown; findings below may reference images not displayed]

FINDINGS: Gallbladder:

No gallstones or wall thickening visualized. No sonographic Murphy
sign noted.

Common bile duct:

Diameter: 4.0 mm

Liver:

Diffuse increased echogenicity and decreased through transmission
suggesting fatty infiltration. Focal fatty sparing noted near the
gallbladder fossa.
IMPRESSION: Normal gallbladder and normal caliber common bile duct.

Fatty infiltration of the liver.

## 2016-07-31 ENCOUNTER — Encounter: Payer: Self-pay | Admitting: Emergency Medicine

## 2016-07-31 ENCOUNTER — Emergency Department
Admission: EM | Admit: 2016-07-31 | Discharge: 2016-07-31 | Disposition: A | Discharge status: Home / Self Care | Payer: PRIVATE HEALTH INSURANCE | Attending: Emergency Medicine | Admitting: Emergency Medicine

## 2016-07-31 DIAGNOSIS — F1010 Alcohol abuse, uncomplicated: Secondary | ICD-10-CM | POA: Insufficient documentation

## 2016-07-31 DIAGNOSIS — Z5181 Encounter for therapeutic drug level monitoring: Secondary | ICD-10-CM | POA: Insufficient documentation

## 2016-07-31 DIAGNOSIS — Z87891 Personal history of nicotine dependence: Secondary | ICD-10-CM | POA: Insufficient documentation

## 2016-07-31 LAB — URINE DRUG SCREEN, QUALITATIVE (ARMC ONLY)
Amphetamines, Ur Screen: NOT DETECTED
BARBITURATES, UR SCREEN: NOT DETECTED
Benzodiazepine, Ur Scrn: NOT DETECTED
CANNABINOID 50 NG, UR ~~LOC~~: NOT DETECTED
COCAINE METABOLITE, UR ~~LOC~~: NOT DETECTED
MDMA (Ecstasy)Ur Screen: NOT DETECTED
Methadone Scn, Ur: NOT DETECTED
Opiate, Ur Screen: NOT DETECTED
PHENCYCLIDINE (PCP) UR S: NOT DETECTED
TRICYCLIC, UR SCREEN: NOT DETECTED

## 2016-07-31 LAB — CBC WITH DIFFERENTIAL/PLATELET
Basophils Absolute: 0.1 10*3/uL (ref 0–0.1)
Basophils Relative: 1 %
Eosinophils Absolute: 0 10*3/uL (ref 0–0.7)
Eosinophils Relative: 1 %
HEMATOCRIT: 48.1 % (ref 40.0–52.0)
Hemoglobin: 16.9 g/dL (ref 13.0–18.0)
LYMPHS ABS: 1.4 10*3/uL (ref 1.0–3.6)
LYMPHS PCT: 24 %
MCH: 33.3 pg (ref 26.0–34.0)
MCHC: 35.1 g/dL (ref 32.0–36.0)
MCV: 94.9 fL (ref 80.0–100.0)
MONO ABS: 0.6 10*3/uL (ref 0.2–1.0)
MONOS PCT: 10 %
NEUTROS ABS: 3.7 10*3/uL (ref 1.4–6.5)
Neutrophils Relative %: 64 %
Platelets: 233 10*3/uL (ref 150–440)
RBC: 5.07 MIL/uL (ref 4.40–5.90)
RDW: 13.3 % (ref 11.5–14.5)
WBC: 5.8 10*3/uL (ref 3.8–10.6)

## 2016-07-31 LAB — COMPREHENSIVE METABOLIC PANEL
ALT: 75 U/L — ABNORMAL HIGH (ref 17–63)
ANION GAP: 9 (ref 5–15)
AST: 75 U/L — AB (ref 15–41)
Albumin: 4.4 g/dL (ref 3.5–5.0)
Alkaline Phosphatase: 73 U/L (ref 38–126)
BILIRUBIN TOTAL: 0.4 mg/dL (ref 0.3–1.2)
BUN: 17 mg/dL (ref 6–20)
CO2: 26 mmol/L (ref 22–32)
Calcium: 9.3 mg/dL (ref 8.9–10.3)
Chloride: 100 mmol/L — ABNORMAL LOW (ref 101–111)
Creatinine, Ser: 0.66 mg/dL (ref 0.61–1.24)
GFR calc Af Amer: >60 mL/min (ref >60)
Glucose, Bld: 133 mg/dL — ABNORMAL HIGH (ref 65–99)
POTASSIUM: 4.1 mmol/L (ref 3.5–5.1)
Sodium: 135 mmol/L (ref 135–145)
TOTAL PROTEIN: 7.7 g/dL (ref 6.5–8.1)

## 2016-07-31 LAB — URINALYSIS COMPLETE WITH MICROSCOPIC (ARMC ONLY)
BACTERIA UA: NONE SEEN
Bilirubin Urine: NEGATIVE
GLUCOSE, UA: NEGATIVE mg/dL
Hgb urine dipstick: NEGATIVE
Ketones, ur: NEGATIVE mg/dL
Leukocytes, UA: NEGATIVE
NITRITE: NEGATIVE
Protein, ur: 30 mg/dL — AB
SPECIFIC GRAVITY, URINE: 1.028 (ref 1.005–1.030)
SQUAMOUS EPITHELIAL / LPF: NONE SEEN
WBC, UA: NONE SEEN WBC/hpf (ref 0–5)
pH: 8 (ref 5.0–8.0)

## 2016-07-31 LAB — ACETAMINOPHEN LEVEL: Acetaminophen (Tylenol), Serum: <10 ug/mL — ABNORMAL LOW (ref 10–30)

## 2016-07-31 LAB — SALICYLATE LEVEL: Salicylate Lvl: <4 mg/dL (ref 2.8–30.0)

## 2016-07-31 LAB — ETHANOL: ALCOHOL ETHYL (B): 47 mg/dL — AB (ref <5)

## 2016-07-31 NOTE — ED Triage Notes (Signed)
Pt states is here for detox from alcohol. Pt is currently nauseated, hand tremors noted. Pt states last etoh yesterday afternoon. Pt states usually consumes 1/5 liquor or more daily.

## 2016-07-31 NOTE — ED Notes (Signed)
Report to jenna, rn.  

## 2016-07-31 NOTE — ED Provider Notes (Signed)
[facility] Emergency Department Provider Note  ____________________________________________   I have reviewed the triage vital signs and the nursing notes.   HISTORY  Chief Complaint Alcohol Problem   History limited by: Not Limited   HPI Luis Richards is a 25 y.o. male who presents to the emergency department today because of desire to detox from alcohol. States he has drunk alcohol everyday for the past three years. Has been drinking liquor. Drinking a fifth of liquor a day. No other drug use recently. Last drank roughly 16 hours ago. Denies any history of seizures or hallucinations when coming off of alcohol. Denies any medical complaints.   History reviewed. No pertinent past medical history.  Patient Active Problem List   Diagnosis Date Noted  . Vitamin D deficiency 03/24/2016  . Fatty liver 07/22/2015  . Smoker 07/22/2015  . Obesity (BMI 30-39.9) 07/22/2015    Past Surgical History:  Procedure Laterality Date  . APPENDECTOMY      Prior to Admission medications   Medication Sig Start Date End Date Taking? Authorizing Provider  cholecalciferol (VITAMIN D) 1000 units tablet Take 1,000 Units by mouth daily.    Historical Provider, MD  Ciclopirox 1 % shampoo Apply 1 application topically at bedtime. 03/24/16   Schuyler Amor, MD    Allergies Review of patient's allergies indicates no known allergies.  Family History  Problem Relation Age of Onset  . Diabetes Father   . Diabetes Paternal Aunt   . Diabetes Paternal Uncle     Social History Social History  Substance Use Topics  . Smoking status: Former Games developer  . Smokeless tobacco: Never Used  . Alcohol use Yes     Comment: daily use    Review of Systems  Constitutional: Negative for fever. Cardiovascular: Negative for chest pain. Respiratory: Negative for shortness of breath. Gastrointestinal: Negative for abdominal pain, vomiting and diarrhea. Neurological: Negative for  headaches, focal weakness or numbness.  10-point ROS otherwise negative.  ____________________________________________   PHYSICAL EXAM:  VITAL SIGNS: ED Triage Vitals  Enc Vitals Group     BP 07/31/16 0541 (!) 153/104     Pulse Rate 07/31/16 0541 (!) 108     Resp 07/31/16 0541 20     Temp 07/31/16 0541 98.6 F (37 C)     Temp Source 07/31/16 0541 Oral     SpO2 07/31/16 0541 100 %     Weight 07/31/16 0541 250 lb (113.4 kg)     Height 07/31/16 0541  (1.676 m)     Head Circumference --      Peak Flow --      Pain Score 07/31/16 0620 6   Constitutional: Alert and oriented. Well appearing and in no distress. Eyes: Conjunctivae are normal. PERRL. Normal extraocular movements. ENT   Head: Normocephalic and atraumatic.   Nose: No congestion/rhinnorhea.   Mouth/Throat: Mucous membranes are moist.   Neck: No stridor. Hematological/Lymphatic/Immunilogical: No cervical lymphadenopathy. Cardiovascular: Normal rate, regular rhythm.  No murmurs, rubs, or gallops. Respiratory: Normal respiratory effort without tachypnea nor retractions. Breath sounds are clear and equal bilaterally. No wheezes/rales/rhonchi. Gastrointestinal: Soft and nontender. No distention.  Genitourinary: Deferred Musculoskeletal: Normal range of motion in all extremities. No joint effusions.  No lower extremity tenderness nor edema. Neurologic:  Normal speech and language. No gross focal neurologic deficits are appreciated.  Skin:  Skin is warm, dry and intact. No rash noted. Psychiatric: Mood and affect are normal. Speech and behavior are normal. Patient exhibits appropriate insight  and judgment.  ____________________________________________    LABS (pertinent positives/negatives)  Labs Reviewed  COMPREHENSIVE METABOLIC PANEL - Abnormal; Notable for the following:       Result Value   Chloride 100 (*)    Glucose, Bld 133 (*)    AST 75 (*)    ALT 75 (*)    All other components within normal  limits  ETHANOL - Abnormal; Notable for the following:    Alcohol, Ethyl (B) 47 (*)    All other components within normal limits  ACETAMINOPHEN LEVEL - Abnormal; Notable for the following:    Acetaminophen (Tylenol), Serum <10 (*)    All other components within normal limits  URINALYSIS COMPLETEWITH MICROSCOPIC ([facility] ONLY) - Abnormal; Notable for the following:    Color, Urine YELLOW (*)    APPearance CLEAR (*)    Protein, ur 30 (*)    All other components within normal limits  CBC WITH DIFFERENTIAL/PLATELET  SALICYLATE LEVEL  URINE DRUG SCREEN, QUALITATIVE ([facility] ONLY)     ____________________________________________   EKG  NOne  ____________________________________________    RADIOLOGY  None  ____________________________________________   PROCEDURES  Procedures  ____________________________________________   INITIAL IMPRESSION / ASSESSMENT AND PLAN / ED COURSE  Pertinent labs & imaging results that were available during my care of the patient were reviewed by me and considered in my medical decision making (see chart for details).  Patient here desiring detox from alcohol. Patient without history of complicated withdrawal. Stated he would like information about residential option over librium taper. Has a brother who can drive him to [facility]. No concerning findings on physical exam. ____________________________________________   FINAL CLINICAL IMPRESSION(S) / ED DIAGNOSES  Final diagnoses:  Alcohol abuse     Note: This dictation was prepared with Dragon dictation. Any transcriptional errors that result from this process are unintentional    Phineas Semen, MD 07/31/16 [phone]

## 2016-07-31 NOTE — Discharge Instructions (Signed)
Please seek medical attention for any high fevers, chest pain, shortness of breath, change in behavior, persistent vomiting, bloody stool or any other new or concerning symptoms.  

## 2016-07-31 NOTE — ED Notes (Signed)
Pt reports he normally drinks a little over 1/5 per day. Pt's last drink was at 3pm yesterday. Pt reports he normally drinks to help sleep. Pt reports he has only slept one hour. Pt reports he has thrown up 10+ times in last 24 hours. Pt denies current nausea. Pt denies current pain. Pt is mildly diaphoretic and has slight tremble to hands. Pt denies hallucinations (auditory/visual). Pt denies tactile disturbances. Pt denies anxiety.  Pt is requesting help with alcohol detox.Pt denies any drug use

## 2016-08-17 ENCOUNTER — Encounter: Payer: Self-pay | Admitting: Medical Oncology

## 2016-08-17 ENCOUNTER — Emergency Department
Admission: EM | Admit: 2016-08-17 | Discharge: 2016-08-17 | Disposition: A | Discharge status: Home / Self Care | Payer: BLUE CROSS/BLUE SHIELD | Attending: Emergency Medicine | Admitting: Emergency Medicine

## 2016-08-17 DIAGNOSIS — F419 Anxiety disorder, unspecified: Secondary | ICD-10-CM | POA: Diagnosis present

## 2016-08-17 DIAGNOSIS — F1010 Alcohol abuse, uncomplicated: Secondary | ICD-10-CM

## 2016-08-17 DIAGNOSIS — Z87891 Personal history of nicotine dependence: Secondary | ICD-10-CM | POA: Diagnosis not present

## 2016-08-17 DIAGNOSIS — F410 Panic disorder [episodic paroxysmal anxiety] without agoraphobia: Secondary | ICD-10-CM

## 2016-08-17 MED ORDER — DIAZEPAM 5 MG PO TABS
10.0000 mg | ORAL_TABLET | Freq: Once | ORAL | Status: AC
  Administered 2016-08-17: 10 mg via ORAL
  Filled 2016-08-17: qty 2

## 2016-08-17 MED ORDER — ONDANSETRON 4 MG PO TBDP
4.0000 mg | ORAL_TABLET | Freq: Once | ORAL | Status: AC
  Administered 2016-08-17: 4 mg via ORAL
  Filled 2016-08-17: qty 1

## 2016-08-17 NOTE — ED Triage Notes (Signed)
Pt reports he has been having anxiety/panic attacks x 3 weeks, has a hx of anxiety but on no medications for it. Pt also reports he drinks about a fifth of liquor a day. Pt denies SI/HI. Pt rocking back and forth in triage. Reports he has had 8 shots this am.

## 2016-08-17 NOTE — ED Provider Notes (Signed)
[facility] Emergency Department Provider Note        Time seen: ----------------------------------------- 9:02 AM on 08/17/2016 -----------------------------------------    I have reviewed the triage vital signs and the nursing notes.   HISTORY  Chief Complaint Anxiety and Other (detox)    HPI Luis Richards is a 25 y.o. male who presents to ER for anxiety and panic attacks for the last 3 weeks. Patient has a history of anxiety but is on no medications for it at this time. He reports he drinks about a fifth of liquor a day, was seen here 2 weeks ago and referred to outpatient detox. Patient reports she's had a shot of liquor this morning.   History reviewed. No pertinent past medical history.  Patient Active Problem List   Diagnosis Date Noted  . Vitamin D deficiency 03/24/2016  . Fatty liver 07/22/2015  . Smoker 07/22/2015  . Obesity (BMI 30-39.9) 07/22/2015    Past Surgical History:  Procedure Laterality Date  . APPENDECTOMY      Allergies Review of patient's allergies indicates no known allergies.  Social History Social History  Substance Use Topics  . Smoking status: Former Games developermoker  . Smokeless tobacco: Never Used  . Alcohol use Yes     Comment: daily use    Review of Systems Constitutional: Negative for fever. Cardiovascular: Negative for chest pain. Respiratory: Negative for shortness of breath. Gastrointestinal: Negative for abdominal pain, vomiting and diarrhea. Genitourinary: Negative for dysuria. Musculoskeletal: Negative for back pain. Skin: Negative for rash. Neurological: Negative for headaches, focal weakness or numbness. Psychiatric: Positive for anxiety, alcohol abuse  10-point ROS otherwise negative.  ____________________________________________   PHYSICAL EXAM:  VITAL SIGNS: ED Triage Vitals [08/17/16 0854]  Enc Vitals Group     BP 125/81     Pulse Rate (!) 121     Resp 20     Temp 98.1 F (36.7  C)     Temp Source Oral     SpO2 97 %     Weight 250 lb (113.4 kg)     Height 5\' 6"  (1.676 m)     Head Circumference      Peak Flow      Pain Score 0     Pain Loc      Pain Edu?      Excl. in GC?     Constitutional: Alert and oriented. Mild distress, anxious Eyes: Conjunctivae are normal. PERRL. Normal extraocular movements. ENT   Head: Normocephalic and atraumatic.   Nose: No congestion/rhinnorhea.   Mouth/Throat: Mucous membranes are moist.   Neck: No stridor. Cardiovascular: Normal rate, regular rhythm. No murmurs, rubs, or gallops. Respiratory: Breath sounds are clear and equal bilaterally. No wheezes/rales/rhonchi. Gastrointestinal: Soft and nontender. Normal bowel sounds Musculoskeletal: Nontender with normal range of motion in all extremities. No lower extremity tenderness nor edema. Neurologic:  Normal speech and language. No gross focal neurologic deficits are appreciated.  Skin:  Skin is warm, dry and intact. No rash noted. Psychiatric: Depressed mood and affect  ____________________________________________  ED COURSE:  Pertinent labs & imaging results that were available during my care of the patient were reviewed by me and considered in my medical decision making (see chart for details). Clinical Course   Patient presents to ER with a combination of alcohol abuse and alcohol use disorder as well as anxiety and panic attacks. We will attempt to improve his symptoms with benzodiazepines however his tolerance will be very high. Procedures  ____________________________________________  FINAL ASSESSMENT AND PLAN  Panic attack, alcohol abuse  Plan: Patient with Chronic alcohol abuse and anxiety. Alcohol seems to be used to help his anxiety symptoms. We will attempt to contact [facility].  [facility] has an adult male bed, he has not required further testing or treatment at this time. He is stable for discharge. Emily FilbertWilliams, Alicia Seib E, MD   Note: This dictation  was prepared with Dragon dictation. Any transcriptional errors that result from this process are unintentional    Emily FilbertJonathan E Cathyrn Deas, MD 08/17/16 [phone]

## 2016-08-17 NOTE — ED Notes (Signed)
MD to bedside.

## 2017-11-28 ENCOUNTER — Encounter: Payer: Self-pay | Admitting: Emergency Medicine

## 2017-11-28 ENCOUNTER — Emergency Department: Payer: Self-pay

## 2017-11-28 ENCOUNTER — Inpatient Hospital Stay
Admission: EM | Admit: 2017-11-28 | Discharge: 2017-11-30 | DRG: 439 | Disposition: A | Discharge status: Home / Self Care | Payer: BLUE CROSS/BLUE SHIELD | Attending: Internal Medicine | Admitting: Internal Medicine

## 2017-11-28 ENCOUNTER — Encounter: Payer: Self-pay | Admitting: Family Medicine

## 2017-11-28 ENCOUNTER — Other Ambulatory Visit: Payer: Self-pay

## 2017-11-28 DIAGNOSIS — K852 Alcohol induced acute pancreatitis without necrosis or infection: Principal | ICD-10-CM | POA: Diagnosis present

## 2017-11-28 DIAGNOSIS — F419 Anxiety disorder, unspecified: Secondary | ICD-10-CM | POA: Diagnosis present

## 2017-11-28 DIAGNOSIS — E860 Dehydration: Secondary | ICD-10-CM | POA: Diagnosis present

## 2017-11-28 DIAGNOSIS — F10931 Alcohol use, unspecified with withdrawal delirium: Secondary | ICD-10-CM

## 2017-11-28 DIAGNOSIS — Z87891 Personal history of nicotine dependence: Secondary | ICD-10-CM

## 2017-11-28 DIAGNOSIS — F1010 Alcohol abuse, uncomplicated: Secondary | ICD-10-CM | POA: Diagnosis present

## 2017-11-28 DIAGNOSIS — K760 Fatty (change of) liver, not elsewhere classified: Secondary | ICD-10-CM | POA: Diagnosis present

## 2017-11-28 DIAGNOSIS — R739 Hyperglycemia, unspecified: Secondary | ICD-10-CM | POA: Diagnosis present

## 2017-11-28 DIAGNOSIS — F10230 Alcohol dependence with withdrawal, uncomplicated: Secondary | ICD-10-CM

## 2017-11-28 DIAGNOSIS — F10231 Alcohol dependence with withdrawal delirium: Secondary | ICD-10-CM | POA: Diagnosis present

## 2017-11-28 DIAGNOSIS — Z23 Encounter for immunization: Secondary | ICD-10-CM

## 2017-11-28 DIAGNOSIS — K859 Acute pancreatitis without necrosis or infection, unspecified: Secondary | ICD-10-CM

## 2017-11-28 DIAGNOSIS — Z6841 Body Mass Index (BMI) 40.0 and over, adult: Secondary | ICD-10-CM

## 2017-11-28 DIAGNOSIS — K7010 Alcoholic hepatitis without ascites: Secondary | ICD-10-CM

## 2017-11-28 DIAGNOSIS — F10930 Alcohol use, unspecified with withdrawal, uncomplicated: Secondary | ICD-10-CM

## 2017-11-28 DIAGNOSIS — E876 Hypokalemia: Secondary | ICD-10-CM | POA: Diagnosis present

## 2017-11-28 DIAGNOSIS — F10239 Alcohol dependence with withdrawal, unspecified: Secondary | ICD-10-CM | POA: Diagnosis present

## 2017-11-28 HISTORY — DX: Morbid (severe) obesity due to excess calories: E66.01

## 2017-11-28 HISTORY — DX: Vitamin D deficiency, unspecified: E55.9

## 2017-11-28 HISTORY — DX: Fatty (change of) liver, not elsewhere classified: K76.0

## 2017-11-28 HISTORY — DX: Alcohol abuse, uncomplicated: F10.10

## 2017-11-28 LAB — URINE DRUG SCREEN, QUALITATIVE (ARMC ONLY)
AMPHETAMINES, UR SCREEN: NOT DETECTED
BARBITURATES, UR SCREEN: NOT DETECTED
Benzodiazepine, Ur Scrn: NOT DETECTED
COCAINE METABOLITE, UR ~~LOC~~: NOT DETECTED
Cannabinoid 50 Ng, Ur ~~LOC~~: POSITIVE — AB
MDMA (Ecstasy)Ur Screen: NOT DETECTED
METHADONE SCREEN, URINE: NOT DETECTED
OPIATE, UR SCREEN: NOT DETECTED
Phencyclidine (PCP) Ur S: NOT DETECTED
Tricyclic, Ur Screen: POSITIVE — AB

## 2017-11-28 LAB — COMPREHENSIVE METABOLIC PANEL
ALT: 189 U/L — ABNORMAL HIGH (ref 17–63)
ANION GAP: 26 — AB (ref 5–15)
AST: 312 U/L — ABNORMAL HIGH (ref 15–41)
Albumin: 3.9 g/dL (ref 3.5–5.0)
Alkaline Phosphatase: 193 U/L — ABNORMAL HIGH (ref 38–126)
BILIRUBIN TOTAL: 8.8 mg/dL — AB (ref 0.3–1.2)
BUN: <5 mg/dL — ABNORMAL LOW (ref 6–20)
CHLORIDE: 88 mmol/L — AB (ref 101–111)
CO2: 17 mmol/L — ABNORMAL LOW (ref 22–32)
Calcium: 9.5 mg/dL (ref 8.9–10.3)
Creatinine, Ser: 0.97 mg/dL (ref 0.61–1.24)
Glucose, Bld: 219 mg/dL — ABNORMAL HIGH (ref 65–99)
POTASSIUM: 2.9 mmol/L — AB (ref 3.5–5.1)
Sodium: 131 mmol/L — ABNORMAL LOW (ref 135–145)
TOTAL PROTEIN: 8.1 g/dL (ref 6.5–8.1)

## 2017-11-28 LAB — URINALYSIS, COMPLETE (UACMP) WITH MICROSCOPIC
BACTERIA UA: NONE SEEN
Specific Gravity, Urine: 1.035 — ABNORMAL HIGH (ref 1.005–1.030)

## 2017-11-28 LAB — CBC
HEMATOCRIT: 44.5 % (ref 40.0–52.0)
HEMOGLOBIN: 15.2 g/dL (ref 13.0–18.0)
MCH: 31.9 pg (ref 26.0–34.0)
MCHC: 34.2 g/dL (ref 32.0–36.0)
MCV: 93.2 fL (ref 80.0–100.0)
Platelets: 87 10*3/uL — ABNORMAL LOW (ref 150–440)
RBC: 4.78 MIL/uL (ref 4.40–5.90)
RDW: 14.1 % (ref 11.5–14.5)
WBC: 5.8 10*3/uL (ref 3.8–10.6)

## 2017-11-28 LAB — PROTIME-INR
INR: 1.03
PROTHROMBIN TIME: 13.4 s (ref 11.4–15.2)

## 2017-11-28 LAB — ETHANOL: Alcohol, Ethyl (B): <10 mg/dL (ref <10)

## 2017-11-28 LAB — HEMOGLOBIN A1C
HEMOGLOBIN A1C: 6.9 % — AB (ref 4.8–5.6)
Mean Plasma Glucose: 151.33 mg/dL

## 2017-11-28 LAB — BLOOD GAS, VENOUS
Acid-base deficit: 0.8 mmol/L (ref 0.0–2.0)
Bicarbonate: 20.1 mmol/L (ref 20.0–28.0)
O2 SAT: 95.3 %
PATIENT TEMPERATURE: 37
PO2 VEN: 68 mmHg — AB (ref 32.0–45.0)
pCO2, Ven: 24 mmHg — ABNORMAL LOW (ref 44.0–60.0)
pH, Ven: 7.53 — ABNORMAL HIGH (ref 7.250–7.430)

## 2017-11-28 LAB — LIPASE, BLOOD: LIPASE: 123 U/L — AB (ref 11–51)

## 2017-11-28 LAB — MAGNESIUM: Magnesium: 0.9 mg/dL — CL (ref 1.7–2.4)

## 2017-11-28 MED ORDER — LORAZEPAM 2 MG/ML IJ SOLN
INTRAMUSCULAR | Status: AC
  Administered 2017-11-28: 1 mg via INTRAVENOUS
  Filled 2017-11-28: qty 1

## 2017-11-28 MED ORDER — LORAZEPAM 2 MG/ML IJ SOLN
1.0000 mg | Freq: Once | INTRAMUSCULAR | Status: AC
  Administered 2017-11-28: 1 mg via INTRAVENOUS

## 2017-11-28 MED ORDER — METOPROLOL TARTRATE 5 MG/5ML IV SOLN
5.0000 mg | INTRAVENOUS | Status: DC | PRN
  Administered 2017-11-28 – 2017-11-29 (×2): 5 mg via INTRAVENOUS
  Filled 2017-11-28 (×2): qty 5

## 2017-11-28 MED ORDER — THIAMINE HCL 100 MG/ML IJ SOLN
100.0000 mg | Freq: Every day | INTRAMUSCULAR | Status: DC
  Filled 2017-11-28 (×3): qty 1

## 2017-11-28 MED ORDER — LORAZEPAM 2 MG/ML IJ SOLN
0.0000 mg | Freq: Two times a day (BID) | INTRAMUSCULAR | Status: DC
Start: 2017-11-30 — End: 2017-11-30

## 2017-11-28 MED ORDER — MAGNESIUM SULFATE 4 GM/100ML IV SOLN
4.0000 g | Freq: Once | INTRAVENOUS | Status: AC
  Administered 2017-11-28: 4 g via INTRAVENOUS
  Filled 2017-11-28 (×2): qty 100

## 2017-11-28 MED ORDER — LORAZEPAM 2 MG PO TABS
2.0000 mg | ORAL_TABLET | Freq: Once | ORAL | Status: AC
  Administered 2017-11-28: 2 mg via ORAL
  Filled 2017-11-28: qty 1

## 2017-11-28 MED ORDER — LORAZEPAM 2 MG/ML IJ SOLN: 1.0000 mg | Freq: Four times a day (QID) | INTRAMUSCULAR | Status: DC | PRN

## 2017-11-28 MED ORDER — ADULT MULTIVITAMIN W/MINERALS CH
1.0000 | ORAL_TABLET | Freq: Every day | ORAL | Status: DC
  Administered 2017-11-28 – 2017-11-30 (×3): 1 via ORAL
  Filled 2017-11-28 (×3): qty 1

## 2017-11-28 MED ORDER — ONDANSETRON HCL 4 MG PO TABS: 4.0000 mg | ORAL_TABLET | Freq: Four times a day (QID) | ORAL | Status: DC | PRN

## 2017-11-28 MED ORDER — LORAZEPAM 2 MG/ML IJ SOLN
0.0000 mg | Freq: Four times a day (QID) | INTRAMUSCULAR | Status: AC
  Administered 2017-11-29: 14:00:00 4 mg via INTRAVENOUS
  Administered 2017-11-29: 12:00:00 2 mg via INTRAVENOUS
  Filled 2017-11-28: qty 2
  Filled 2017-11-28 (×2): qty 1

## 2017-11-28 MED ORDER — LORAZEPAM 2 MG PO TABS: 0.0000 mg | ORAL_TABLET | Freq: Two times a day (BID) | ORAL | Status: DC

## 2017-11-28 MED ORDER — FOLIC ACID 1 MG PO TABS
1.0000 mg | ORAL_TABLET | Freq: Every day | ORAL | Status: DC
  Administered 2017-11-28 – 2017-11-30 (×3): 1 mg via ORAL
  Filled 2017-11-28 (×3): qty 1

## 2017-11-28 MED ORDER — IBUPROFEN 400 MG PO TABS
600.0000 mg | ORAL_TABLET | Freq: Three times a day (TID) | ORAL | Status: DC | PRN
  Administered 2017-11-28: 600 mg via ORAL
  Filled 2017-11-28: qty 2

## 2017-11-28 MED ORDER — VITAMIN B-1 100 MG PO TABS
100.0000 mg | ORAL_TABLET | Freq: Every day | ORAL | Status: DC
  Administered 2017-11-28 – 2017-11-30 (×3): 100 mg via ORAL
  Filled 2017-11-28 (×3): qty 1

## 2017-11-28 MED ORDER — OXYCODONE HCL 5 MG PO TABS
5.0000 mg | ORAL_TABLET | ORAL | Status: DC | PRN
  Administered 2017-11-28: 5 mg via ORAL
  Filled 2017-11-28: qty 1

## 2017-11-28 MED ORDER — LORAZEPAM 1 MG PO TABS
1.0000 mg | ORAL_TABLET | Freq: Four times a day (QID) | ORAL | Status: DC | PRN
  Filled 2017-11-28: qty 1

## 2017-11-28 MED ORDER — PNEUMOCOCCAL VAC POLYVALENT 25 MCG/0.5ML IJ INJ
0.5000 mL | INJECTION | INTRAMUSCULAR | Status: AC
  Administered 2017-11-29: 0.5 mL via INTRAMUSCULAR
  Filled 2017-11-28: qty 0.5

## 2017-11-28 MED ORDER — LORAZEPAM 2 MG PO TABS
0.0000 mg | ORAL_TABLET | Freq: Four times a day (QID) | ORAL | Status: AC
  Administered 2017-11-28 (×2): 1 mg via ORAL
  Filled 2017-11-28: qty 1

## 2017-11-28 MED ORDER — THIAMINE HCL 100 MG/ML IJ SOLN: 100.0000 mg | Freq: Every day | INTRAMUSCULAR | Status: DC

## 2017-11-28 MED ORDER — ACETAMINOPHEN 650 MG RE SUPP: 650.0000 mg | Freq: Four times a day (QID) | RECTAL | Status: DC | PRN

## 2017-11-28 MED ORDER — VITAMIN B-1 100 MG PO TABS
100.0000 mg | ORAL_TABLET | Freq: Every day | ORAL | Status: DC
  Administered 2017-11-28: 100 mg via ORAL
  Filled 2017-11-28: qty 1

## 2017-11-28 MED ORDER — ONDANSETRON HCL 4 MG/2ML IJ SOLN: 4.0000 mg | Freq: Four times a day (QID) | INTRAMUSCULAR | Status: DC | PRN

## 2017-11-28 MED ORDER — DILTIAZEM HCL 30 MG PO TABS
60.0000 mg | ORAL_TABLET | Freq: Three times a day (TID) | ORAL | Status: DC
  Administered 2017-11-28 – 2017-11-30 (×7): 60 mg via ORAL
  Filled 2017-11-28 (×7): qty 2

## 2017-11-28 MED ORDER — SODIUM CHLORIDE 0.9 % IV SOLN
Freq: Once | INTRAVENOUS | Status: AC
  Administered 2017-11-28: 1000 mL via INTRAVENOUS

## 2017-11-28 MED ORDER — SODIUM CHLORIDE 0.9 % IV SOLN
Freq: Once | INTRAVENOUS | Status: AC
  Administered 2017-11-28: 13:00:00 via INTRAVENOUS

## 2017-11-28 MED ORDER — ACETAMINOPHEN 325 MG PO TABS
650.0000 mg | ORAL_TABLET | Freq: Four times a day (QID) | ORAL | Status: DC | PRN
Start: 2017-11-28 — End: 2017-11-30
  Administered 2017-11-28: 17:00:00 650 mg via ORAL
  Filled 2017-11-28: qty 2

## 2017-11-28 MED ORDER — INFLUENZA VAC SPLIT QUAD 0.5 ML IM SUSY
0.5000 mL | PREFILLED_SYRINGE | INTRAMUSCULAR | Status: AC
  Administered 2017-11-29: 11:00:00 0.5 mL via INTRAMUSCULAR
  Filled 2017-11-28: qty 0.5

## 2017-11-28 MED ORDER — PANTOPRAZOLE SODIUM 40 MG IV SOLR
40.0000 mg | Freq: Two times a day (BID) | INTRAVENOUS | Status: DC
  Administered 2017-11-28 – 2017-11-30 (×5): 40 mg via INTRAVENOUS
  Filled 2017-11-28 (×5): qty 40

## 2017-11-28 MED ORDER — POTASSIUM CHLORIDE IN NACL 40-0.9 MEQ/L-% IV SOLN
INTRAVENOUS | Status: DC
  Administered 2017-11-28: 100 mL/h via INTRAVENOUS
  Administered 2017-11-30: 75 mL/h via INTRAVENOUS
  Filled 2017-11-28 (×7): qty 1000

## 2017-11-28 MED ORDER — POTASSIUM CHLORIDE CRYS ER 20 MEQ PO TBCR
40.0000 meq | EXTENDED_RELEASE_TABLET | Freq: Once | ORAL | Status: AC
  Administered 2017-11-28: 40 meq via ORAL
  Filled 2017-11-28: qty 2

## 2017-11-28 MED ORDER — ENOXAPARIN SODIUM 40 MG/0.4ML ~~LOC~~ SOLN
40.0000 mg | Freq: Two times a day (BID) | SUBCUTANEOUS | Status: DC
  Administered 2017-11-28 – 2017-11-30 (×4): 40 mg via SUBCUTANEOUS
  Filled 2017-11-28 (×4): qty 0.4

## 2017-11-28 NOTE — ED Notes (Signed)
Patient transported to Ultrasound 

## 2017-11-28 NOTE — ED Notes (Signed)
Family at bedside. 

## 2017-11-28 NOTE — ED Triage Notes (Addendum)
Pt to ed with c/o vomiting x 1 week.  Pt reports unable to keep fluids down at all.  Pt denies diarrhea, reports abd pain.  Reports went to [facility] today and was sent here for evaluation.  Pt is orthostatic, sitting 128/99, HR 152, standing 101/76, HR 164. Active vomiting x 2 noted at triage, IV started and labs drawn and sent.  Fluids hung

## 2017-11-28 NOTE — ED Notes (Signed)
PT appears to be more comfortable at this time, tremors and anxiety have decreased per pt and noted on assessment. Will continue to monitor

## 2017-11-28 NOTE — H&P (Signed)
[employer] [facility] [place] [facility]   PATIENT NAME: Luis Richards    MR#:  [id number]  DATE OF BIRTH:  [birth year]  DATE OF ADMISSION:  11/28/2017  PRIMARY CARE PHYSICIAN: Schuyler AmorPlonk, William, MD   REQUESTING/REFERRING PHYSICIAN: Daryel NovemberWilliams Jonathan MD  CHIEF COMPLAINT:   Chief Complaint  Patient presents with  . Emesis    HISTORY OF PRESENT ILLNESS: Luis Richards  is a 26 y.o. male with a known history of heavy alcohol abuse who presents with nausea vomiting and abdominal discomfort ongoing for more than a week.  Patient reports that he started having emesis about a week ago and it continue to persist.  Subsequently he started having abdominal pain and substernal discomfort.  Patient came to the ED and was noted to have hypokalemia he was noted to be hypertensive and tachycardic.  He reports that his last drink was 3 days ago.  Patient denies any diarrhea  PAST MEDICAL HISTORY:   Past Medical History:  Diagnosis Date  . Alcohol abuse   . Fatty liver   . Morbid obesity (HCC)   . Vitamin D deficiency     PAST SURGICAL HISTORY:  Past Surgical History:  Procedure Laterality Date  . APPENDECTOMY      SOCIAL HISTORY:  Social History   Tobacco Use  . Smoking status: Former Games developermoker  . Smokeless tobacco: Never Used  Substance Use Topics  . Alcohol use: Yes    Comment: daily use    FAMILY HISTORY:  Family History  Problem Relation Age of Onset  . Diabetes Father   . Diabetes Paternal Aunt   . Diabetes Paternal Uncle     DRUG ALLERGIES: No Known Allergies  REVIEW OF SYSTEMS:   CONSTITUTIONAL: No fever, fatigue or weakness.  EYES: No blurred or double vision.  EARS, NOSE, AND THROAT: No tinnitus or ear pain.  RESPIRATORY: No cough, shortness of breath, wheezing or hemoptysis.  CARDIOVASCULAR: No chest pain, orthopnea, edema.  GASTROINTESTINAL: No nausea, vomiting, diarrhea or abdominal pain.  GENITOURINARY: No dysuria, hematuria.  ENDOCRINE: No  polyuria, nocturia,  HEMATOLOGY: No anemia, easy bruising or bleeding SKIN: No rash or lesion. MUSCULOSKELETAL: No joint pain or arthritis.   NEUROLOGIC: No tingling, numbness, weakness.  PSYCHIATRY: No anxiety or depression.   MEDICATIONS AT HOME:  Prior to Admission medications   Not on File      PHYSICAL EXAMINATION:   VITAL SIGNS: Blood pressure (!) 148/85, pulse (!) 147, temperature 98.5 F (36.9 C), temperature source Oral, resp. rate (!) 27, height 5\' 6"  (1.676 m), weight (!) 320 lb (145.2 kg), SpO2 97 %.  GENERAL:  26 y.o.-year-old patient lying in the bed with no acute distress.  EYES: Pupils equal, round, reactive to light and accommodation. No scleral icterus. Extraocular muscles intact.  HEENT: Head atraumatic, normocephalic. Oropharynx and nasopharynx clear.  NECK:  Supple, no jugular venous distention. No thyroid enlargement, no tenderness.  LUNGS: Normal breath sounds bilaterally, no wheezing, rales,rhonchi or crepitation. No use of accessory muscles of respiration.  CARDIOVASCULAR: S1, S2 normal. No murmurs, rubs, or gallops.  ABDOMEN: Soft, epigastric tenderness, nondistended. Bowel sounds present. No organomegaly or mass.  EXTREMITIES: No pedal edema, cyanosis, or clubbing.  NEUROLOGIC: Cranial nerves II through XII are intact. Muscle strength 5/5 in all extremities. Sensation intact. Gait not checked.  PSYCHIATRIC: The patient is alert and oriented x 3.  SKIN: No obvious rash, lesion, or ulcer.   LABORATORY PANEL:   CBC Recent Labs  Lab 11/28/17 1207  WBC 5.8  HGB 15.2  HCT 44.5  PLT 87*  MCV 93.2  MCH 31.9  MCHC 34.2  RDW 14.1   ------------------------------------------------------------------------------------------------------------------  Chemistries  Recent Labs  Lab 11/28/17 1207  NA 131*  K 2.9*  CL 88*  CO2 17*  GLUCOSE 219*  BUN <5*  CREATININE 0.97  CALCIUM 9.5  AST 312*  ALT 189*  ALKPHOS 193*  BILITOT 8.8*    ------------------------------------------------------------------------------------------------------------------ estimated creatinine clearance is 156 mL/min (by C-G formula based on SCr of 0.97 mg/dL). ------------------------------------------------------------------------------------------------------------------ No results for input(s): TSH, T4TOTAL, T3FREE, THYROIDAB in the last 72 hours.  Invalid input(s): FREET3   Coagulation profile Recent Labs  Lab 11/28/17 1228  INR 1.03   ------------------------------------------------------------------------------------------------------------------- No results for input(s): DDIMER in the last 72 hours. -------------------------------------------------------------------------------------------------------------------  Cardiac Enzymes No results for input(s): CKMB, TROPONINI, MYOGLOBIN in the last 168 hours.  Invalid input(s): CK ------------------------------------------------------------------------------------------------------------------ Invalid input(s): POCBNP  ---------------------------------------------------------------------------------------------------------------  Urinalysis    Component Value Date/Time   COLORURINE AMBER (A) 11/28/2017 1228   APPEARANCEUR HAZY (A) 11/28/2017 1228   LABSPEC 1.035 (H) 11/28/2017 1228   PHURINE  11/28/2017 1228    TEST NOT REPORTED DUE TO COLOR INTERFERENCE OF URINE PIGMENT   GLUCOSEU (A) 11/28/2017 1228    TEST NOT REPORTED DUE TO COLOR INTERFERENCE OF URINE PIGMENT   HGBUR (A) 11/28/2017 1228    TEST NOT REPORTED DUE TO COLOR INTERFERENCE OF URINE PIGMENT   BILIRUBINUR (A) 11/28/2017 1228    TEST NOT REPORTED DUE TO COLOR INTERFERENCE OF URINE PIGMENT   BILIRUBINUR negative 07/21/2015 1109   KETONESUR (A) 11/28/2017 1228    TEST NOT REPORTED DUE TO COLOR INTERFERENCE OF URINE PIGMENT   PROTEINUR (A) 11/28/2017 1228    TEST NOT REPORTED DUE TO COLOR INTERFERENCE OF URINE  PIGMENT   UROBILINOGEN 0.2 07/21/2015 1109   NITRITE (A) 11/28/2017 1228    TEST NOT REPORTED DUE TO COLOR INTERFERENCE OF URINE PIGMENT   LEUKOCYTESUR (A) 11/28/2017 1228    TEST NOT REPORTED DUE TO COLOR INTERFERENCE OF URINE PIGMENT     RADIOLOGY: No results found.  EKG: Orders placed or performed during the hospital encounter of 11/28/17  . ED EKG  . ED EKG  . ED EKG  . ED EKG    IMPRESSION AND PLAN: Patient is a 26 year old with history of heavy alcohol abuse presenting with abdominal pain nausea vomiting noted to be tachycardic  1.  Alcohol withdrawal I will place patient on a CIWA protocol  2.  Abdominal pain with nausea vomiting Patient's lipase is mildly elevated. This could be related to combination of gastritis mild acute pancreatitis and acute alcoholic hepatitis Will place patient on IV Protonix Keep n.p.o. except meds repeat lipase in the morning  3.  Hypokalemia as a result of vomiting we will replace his potassium recheck in the morning Check magnesium  4.  Elevated blood sugar check a hemoglobin A1c based on patient's weight he may be a diabetic  5.  Elevated LFTs likely due to alcohol induced hepatitis I will check a hepatitis panel as well  6.  Miscellaneous Lovenox for DVT prophylaxis    All the records are reviewed and case discussed with ED provider. Management plans discussed with the patient, family and they are in agreement.  CODE STATUS:    Code Status Orders  (From admission, onward)        Start     Ordered   11/28/17 1256  Full code  Continuous  11/28/17 1255    Code Status History    Date Active Date Inactive Code Status Order ID Comments User Context   This patient has a current code status but no historical code status.       TOTAL TIME TAKING CARE OF THIS PATIENT: 55 minutes.    Auburn BilberryPATEL, Daelyn Mozer M.D on 11/28/2017 at 3:10 PM  Between 7am to 6pm - Pager - [phone]  After 6pm go to [email or web address] - password [email or web address]  [facility]  [facility]  Office  [phone]  CC: Primary care physician; Schuyler AmorPlonk, William, MD

## 2017-11-28 NOTE — ED Notes (Signed)
Pt states he drinks aprox 10 shots daily, last drink was x3 days ago. Pt has obvious tremors and anxiety at this time, MD at bedside

## 2017-11-28 NOTE — Progress Notes (Signed)
Anticoagulation Monitoring 26 y/o M ordered Lovenox 40 mg daily for DVT prophylaxis.   Filed Weights   11/28/17 1200  Weight: (!) 320 lb (145.2 kg)   Body mass index is 51.65 kg/m.  Estimated Creatinine Clearance: 156 mL/min (by C-G formula based on SCr of 0.97 mg/dL).  Will increase Lovenox to 40 mg bid for BMI > 40.   Luisa HartScott Lenaya Pietsch, PharmD Clinical Pharmacist

## 2017-11-28 NOTE — ED Notes (Signed)
Pt still has notable tremors, HR 130s, MD verbal orders for 2mg  ativan PO, see MAR

## 2017-11-28 NOTE — ED Provider Notes (Addendum)
[facility] Emergency Department Provider Note       Time seen: ----------------------------------------- 12:28 PM on 11/28/2017 -----------------------------------------   I have reviewed the triage vital signs and the nursing notes.  HISTORY   Chief Complaint Emesis    HPI Luis Richards is a 26 y.o. male with a history of chronic alcoholism who presents to the ED for vomiting over the past week. Patient reports being unable to keep any fluids down at all. He denies any diarrhea but reports abdominal pain. He went to fast mid today and was encouraged comes to ER for evaluation. He was found to be hypertensive and tachycardic. He was actively vomiting on arrival here in triage. Patient typically takes 10 shots of liquor per day but he has not had any alcohol in last 3 days because he has not felt well.  History reviewed. No pertinent past medical history.  Patient Active Problem List   Diagnosis Date Noted  . Vitamin D deficiency 03/24/2016  . Fatty liver 07/22/2015  . Smoker 07/22/2015  . Obesity (BMI 30-39.9) 07/22/2015    Past Surgical History:  Procedure Laterality Date  . APPENDECTOMY      Allergies Patient has no known allergies.  Social History Social History   Tobacco Use  . Smoking status: Former Games developermoker  . Smokeless tobacco: Never Used  Substance Use Topics  . Alcohol use: Yes    Comment: daily use  . Drug use: No    Review of Systems Constitutional: Negative for fever. Eyes: Negative for vision changes ENT:  Negative for congestion, sore throat Cardiovascular: Negative for chest pain. Respiratory: Negative for shortness of breath. Gastrointestinal: Positive for abdominal pain, vomiting Genitourinary: Negative for dysuria. Musculoskeletal: Negative for back pain. Skin: Negative for rash. Neurological: Positive for generalized weakness  All systems negative/normal/unremarkable except as stated in the  HPI  ____________________________________________   PHYSICAL EXAM:  VITAL SIGNS: ED Triage Vitals  Enc Vitals Group     BP 11/28/17 1159 101/76     Pulse Rate 11/28/17 1159 (!) 164     Resp 11/28/17 1159 (!) 22     Temp 11/28/17 1159 98.5 F (36.9 C)     Temp Source 11/28/17 1159 Oral     SpO2 11/28/17 1159 96 %     Weight 11/28/17 1200 (!) 320 lb (145.2 kg)     Height 11/28/17 1200 5\' 6"  (1.676 m)     Head Circumference --      Peak Flow --      Pain Score 11/28/17 1159 8     Pain Loc --      Pain Edu? --      Excl. in GC? --    Constitutional: Alert and oriented. Moderate distress Eyes: Conjunctivae are normal. Normal extraocular movements. ENT   Head: Normocephalic and atraumatic.   Nose: No congestion/rhinnorhea.   Mouth/Throat: Mucous membranes are moist.   Neck: No stridor. Cardiovascular: Rapid rate, regular rhythm. No murmurs, rubs, or gallops. Respiratory: Normal respiratory effort without tachypnea nor retractions. Breath sounds are clear and equal bilaterally. No wheezes/rales/rhonchi. Gastrointestinal: Soft and nontender. Normal bowel sounds. Possible ascites Musculoskeletal: Nontender with normal range of motion in extremities. No lower extremity tenderness nor edema. Neurologic:  Normal speech and language. No gross focal neurologic deficits are appreciated. Resting tremor is noted Skin:  Skin is warm, dry and intact. Mild erythema Psychiatric: Anxious mood and affect ____________________________________________  EKG: Interpreted by me. Sinus tachycardia with a rate of 157 bpm,  narrow QRS, long QT, normal axis.  ____________________________________________  ED COURSE:  Pertinent labs & imaging results that were available during my care of the patient were reviewed by me and considered in my medical decision making (see chart for details). Patient presents for likely alcohol withdrawal, we will assess with labs and imaging as  indicated. Clinical Course as of Nov 28 1328  Mon Nov 28, 2017  1327 pH, Ven: (!) 7.53 [JW]  1327 pCO2, Ven: (!) 24 [JW]    Clinical Course User Index [JW] Emily Filbert, MD   Procedures ____________________________________________   LABS (pertinent positives/negatives)  Labs Reviewed  LIPASE, BLOOD - Abnormal; Notable for the following components:      Result Value   Lipase 123 (*)    All other components within normal limits  COMPREHENSIVE METABOLIC PANEL - Abnormal; Notable for the following components:   Sodium 131 (*)    Potassium 2.9 (*)    Chloride 88 (*)    CO2 17 (*)    Glucose, Bld 219 (*)    BUN <5 (*)    AST 312 (*)    ALT 189 (*)    Alkaline Phosphatase 193 (*)    Total Bilirubin 8.8 (*)    Anion gap 26 (*)    All other components within normal limits  CBC - Abnormal; Notable for the following components:   Platelets 87 (*)    All other components within normal limits  URINALYSIS, COMPLETE (UACMP) WITH MICROSCOPIC - Abnormal; Notable for the following components:   Color, Urine AMBER (*)    APPearance HAZY (*)    Specific Gravity, Urine 1.035 (*)    Glucose, UA   (*)    Value: TEST NOT REPORTED DUE TO COLOR INTERFERENCE OF URINE PIGMENT   Hgb urine dipstick   (*)    Value: TEST NOT REPORTED DUE TO COLOR INTERFERENCE OF URINE PIGMENT   Bilirubin Urine   (*)    Value: TEST NOT REPORTED DUE TO COLOR INTERFERENCE OF URINE PIGMENT   Ketones, ur   (*)    Value: TEST NOT REPORTED DUE TO COLOR INTERFERENCE OF URINE PIGMENT   Protein, ur   (*)    Value: TEST NOT REPORTED DUE TO COLOR INTERFERENCE OF URINE PIGMENT   Nitrite   (*)    Value: TEST NOT REPORTED DUE TO COLOR INTERFERENCE OF URINE PIGMENT   Leukocytes, UA   (*)    Value: TEST NOT REPORTED DUE TO COLOR INTERFERENCE OF URINE PIGMENT   Squamous Epithelial / LPF 0-5 (*)    Non Squamous Epithelial 0-5 (*)    All other components within normal limits  BLOOD GAS, VENOUS - Abnormal; Notable for the  following components:   pH, Ven 7.53 (*)    pCO2, Ven 24 (*)    pO2, Ven 68.0 (*)    All other components within normal limits  PROTIME-INR  ETHANOL  URINE DRUG SCREEN, QUALITATIVE ([facility] ONLY)   CRITICAL CARE Performed by: Emily Filbert   Total critical care time: 30 minutes  Critical care time was exclusive of separately billable procedures and treating other patients.  Critical care was necessary to treat or prevent imminent or life-threatening deterioration.  Critical care was time spent personally by me on the following activities: development of treatment plan with patient and/or surrogate as well as nursing, discussions with consultants, evaluation of patient's response to treatment, examination of patient, obtaining history from patient or surrogate, ordering and performing treatments and  interventions, ordering and review of laboratory studies, ordering and review of radiographic studies, pulse oximetry and re-evaluation of patient's condition. ____________________________________________  DIFFERENTIAL DIAGNOSIS   Alcohol withdrawal, DTs, electrolyte abnormality, renal failure, liver failure   FINAL ASSESSMENT AND PLAN  Alcohol withdrawal, hyperbilirubinemia, pancreatitis  Plan: Patient had presented for vomiting and weakness. Patient's labs likely indicated alcohol withdrawal possibly alcoholic ketoacidosis. We started treating with fluids and placed him on CIWA protocol. He has had approximate 4 mg of Ativan so far. Blood pressure currently is in the 140s and blood pressure is 151/122. Due to his severe withdrawal and dehydration he will need to be admitted. I will discuss with the hospitalist for admission.   Emily FilbertWilliams, Jonathan E, MD   Note: This note was generated in part or whole with voice recognition software. Voice recognition is usually quite accurate but there are transcription errors that can and very often do occur. I apologize for any typographical  errors that were not detected and corrected.     Emily FilbertWilliams, Jonathan E, MD 11/28/17 1417    Emily FilbertWilliams, Jonathan E, MD 11/28/17 [phone]

## 2017-11-29 DIAGNOSIS — F10931 Alcohol use, unspecified with withdrawal delirium: Secondary | ICD-10-CM

## 2017-11-29 DIAGNOSIS — F10231 Alcohol dependence with withdrawal delirium: Secondary | ICD-10-CM

## 2017-11-29 DIAGNOSIS — K7010 Alcoholic hepatitis without ascites: Secondary | ICD-10-CM

## 2017-11-29 DIAGNOSIS — K859 Acute pancreatitis without necrosis or infection, unspecified: Secondary | ICD-10-CM

## 2017-11-29 LAB — COMPREHENSIVE METABOLIC PANEL
ALBUMIN: 3.2 g/dL — AB (ref 3.5–5.0)
ALK PHOS: 147 U/L — AB (ref 38–126)
ALT: 141 U/L — ABNORMAL HIGH (ref 17–63)
ANION GAP: 12 (ref 5–15)
AST: 221 U/L — ABNORMAL HIGH (ref 15–41)
BUN: <5 mg/dL — ABNORMAL LOW (ref 6–20)
CALCIUM: 8.6 mg/dL — AB (ref 8.9–10.3)
CO2: 24 mmol/L (ref 22–32)
Chloride: 96 mmol/L — ABNORMAL LOW (ref 101–111)
Creatinine, Ser: 0.8 mg/dL (ref 0.61–1.24)
GFR calc non Af Amer: >60 mL/min (ref >60)
GLUCOSE: 144 mg/dL — AB (ref 65–99)
POTASSIUM: 2.9 mmol/L — AB (ref 3.5–5.1)
SODIUM: 132 mmol/L — AB (ref 135–145)
Total Bilirubin: 9.4 mg/dL — ABNORMAL HIGH (ref 0.3–1.2)
Total Protein: 6.8 g/dL (ref 6.5–8.1)

## 2017-11-29 LAB — CBC
HCT: 38.5 % — ABNORMAL LOW (ref 40.0–52.0)
HEMOGLOBIN: 13.3 g/dL (ref 13.0–18.0)
MCH: 32.4 pg (ref 26.0–34.0)
MCHC: 34.7 g/dL (ref 32.0–36.0)
MCV: 93.4 fL (ref 80.0–100.0)
PLATELETS: 70 10*3/uL — AB (ref 150–440)
RBC: 4.12 MIL/uL — ABNORMAL LOW (ref 4.40–5.90)
RDW: 14.3 % (ref 11.5–14.5)
WBC: 3.9 10*3/uL (ref 3.8–10.6)

## 2017-11-29 LAB — PHOSPHORUS: PHOSPHORUS: 1.1 mg/dL — AB (ref 2.5–4.6)

## 2017-11-29 LAB — MAGNESIUM: MAGNESIUM: 2 mg/dL (ref 1.7–2.4)

## 2017-11-29 LAB — LIPASE, BLOOD: Lipase: 89 U/L — ABNORMAL HIGH (ref 11–51)

## 2017-11-29 LAB — HIV ANTIBODY (ROUTINE TESTING W REFLEX): HIV SCREEN 4TH GENERATION: NONREACTIVE

## 2017-11-29 MED ORDER — LORAZEPAM 2 MG/ML IJ SOLN
INTRAMUSCULAR | Status: AC
Start: 2017-11-29 — End: 2017-11-29
  Administered 2017-11-29: 2 mg
  Filled 2017-11-29: qty 1

## 2017-11-29 MED ORDER — LORAZEPAM 2 MG/ML IJ SOLN
2.0000 mg | INTRAMUSCULAR | Status: AC | PRN
Start: 2017-11-29 — End: 2017-11-29

## 2017-11-29 MED ORDER — LORAZEPAM 2 MG/ML IJ SOLN
2.0000 mg | Freq: Once | INTRAMUSCULAR | Status: AC
  Administered 2017-11-29: 2 mg via INTRAMUSCULAR

## 2017-11-29 MED ORDER — HALOPERIDOL LACTATE 2 MG/ML PO CONC
2.0000 mg | Freq: Four times a day (QID) | ORAL | Status: DC | PRN
  Filled 2017-11-29: qty 1

## 2017-11-29 MED ORDER — ZIPRASIDONE MESYLATE 20 MG IM SOLR
20.0000 mg | Freq: Once | INTRAMUSCULAR | Status: DC
  Filled 2017-11-29: qty 20

## 2017-11-29 MED ORDER — LORAZEPAM 2 MG/ML IJ SOLN
2.0000 mg | Freq: Once | INTRAMUSCULAR | Status: AC
  Administered 2017-11-29: 16:00:00 2 mg via INTRAVENOUS

## 2017-11-29 MED ORDER — DEXTROSE 50 % IV SOLN: 25.0000 mL | Freq: Once | INTRAVENOUS | Status: DC

## 2017-11-29 MED ORDER — LORAZEPAM 2 MG PO TABS: 2.0000 mg | ORAL_TABLET | Freq: Once | ORAL | Status: AC

## 2017-11-29 MED ORDER — POTASSIUM CHLORIDE CRYS ER 20 MEQ PO TBCR
40.0000 meq | EXTENDED_RELEASE_TABLET | ORAL | Status: AC
  Administered 2017-11-29: 13:00:00 40 meq via ORAL
  Filled 2017-11-29: qty 2

## 2017-11-29 NOTE — Progress Notes (Signed)
Inpatient Diabetes Program Recommendations  AACE/ADA: New Consensus Statement on Inpatient Glycemic Control (2015)  Target Ranges:  Prepandial:   less than 140 mg/dL      Peak postprandial:   less than 180 mg/dL (1-2 hours)      Critically ill patients:  140 - 180 mg/dL  Results for Luis Richards, Luis Richards (MRN [id number]) as of 11/29/2017 10:16  Ref. Range 11/28/2017 12:07 11/29/2017 04:47  Glucose Latest Ref Range: 65 - 99 mg/dL 409219 (H) 811144 (H)   Results for Luis Richards, Luis Richards (MRN [id number]) as of 11/29/2017 10:16  Ref. Range 11/28/2017 12:07  Hemoglobin A1C Latest Ref Range: 4.8 - 5.6 % 6.9 (H)    Review of Glycemic Control  Diabetes history: No Outpatient Diabetes medications: NA Current orders for Inpatient glycemic control: NA  Inpatient Diabetes Program Recommendations: Correction (SSI): While inpatient, please consider ordering CBGs with Novolog correction scale ACHS. HgbA1C: A1C 6.9% on 11/28/17. Per ADA, if A1C 6.5% or greater then meets criteria to dx with DM. MD, please make note if patient will be dx with DM at this time. If so, please inform patient and nursing staff so patient can be educated by bedside nursing.  Thanks, Orlando PennerMarie Navdeep Halt, RN, MSN, CDE Diabetes Coordinator Inpatient Diabetes Program [phone] (Team Pager from 8am to 5pm)

## 2017-11-29 NOTE — Consult Note (Signed)
[facility] Consult   Reason for Consult: Consult for 26 year old man who came into the hospital with alcohol withdrawal and pancreatitis.  Urgent consult requested because of agitation Referring Physician:  Tressia Miners Patient Identification: Luis Richards MRN:  [id number] Principal Diagnosis: Delirium tremens [facility]) Diagnosis:   Patient Active Problem List   Diagnosis Date Noted  . Delirium tremens ([place]) [F10.231] 11/29/2017  . Alcoholic hepatitis [H47.42] 11/29/2017  . Pancreatitis, acute [K85.90] 11/29/2017  . Alcohol abuse [F10.10] 11/28/2017  . Alcohol dependence with withdrawal ([place]) [F10.239] 11/28/2017  . Vitamin D deficiency [E55.9] 03/24/2016  . Fatty liver [K76.0] 07/22/2015  . Smoker [F17.200] 07/22/2015  . Obesity (BMI 30-39.9) [E66.9] 07/22/2015    Total Time spent with patient: 1 hour  Subjective:   Luis Richards is a 26 y.o. male patient admitted with "I just do not feel comfortable".  HPI: Patient interviewed chart reviewed.  Spent some time working with the patient acutely in the hospital room.  26 year old man came to the emergency room yesterday with acute abdominal pain nausea and vomiting.  History revealed recent alcohol abuse with most recent consumption probably 2-3 days previously.  Lab tests show acute pancreatitis with hypokalemia and alcoholic hepatitis.  Today the patient was being treated on the medical service when he became agitated.  He pulled out his IV.  Started talking about leaving the hospital.  Patient was observed to clearly be confused.  When I came to see him the patient and his father were in the room.  Patient is fluent in [place].  Security staff were already available but not intervening directly.  Patient appeared to be delirious.  He was able to communicate but had waxing and waning consciousness and confusion.  Did not consistently understand where he was.  He was barely able to follow very basic information.  Patient  appeared to be getting agitated and anxious and insisting that he would leave the hospital but again it was clear that he did not understand the situation well.  Patient had reported that he has a history of heavy drinking last drink was reported as being a couple days prior to coming to the emergency room.  Social history: Patient's father who only speaks Spanish is present in the room.  Patient talks at one point about feeling like his father makes him feel belittled but it is unclear exactly what to make of that given the patient's mental state.  During the time I was there the patient's brother arrived.  Patient clearly trusts and has a good relationship with his brother.  Medical history: History of obesity now history of alcoholic hepatitis pancreatitis.  Substance abuse history: Full details unclear but he  has a history of heavy alcohol abuse.  Not known whether he has had DTs or seizures in the past.  Drug screen also positive for try cyclic's and cannabis.  But not known whether he has ever engaged in substance previously.  Past Psychiatric History: No known previous psychiatric intervention or treatment.  Brother says that he has chronic anxiety problems but he does not appear to be under any specific treatment.  Risk to Self: Is patient at risk for suicide?: No Risk to Others:   Prior Inpatient Therapy:   Prior Outpatient Therapy:    Past Medical History:  Past Medical History:  Diagnosis Date  . Alcohol abuse   . Fatty liver   . Morbid obesity ([place])   . Vitamin D deficiency     Past Surgical  History:  Procedure Laterality Date  . APPENDECTOMY     Family History:  Family History  Problem Relation Age of Onset  . Diabetes Father   . Diabetes Paternal Aunt   . Diabetes Paternal Uncle    Family Psychiatric  History: Unknown Social History:  Social History   Substance and Sexual Activity  Alcohol Use Yes   Comment: daily use     Social History   Substance and  Sexual Activity  Drug Use No    Social History   Socioeconomic History  . Marital status: Single    Spouse name: None  . Number of children: None  . Years of education: None  . Highest education level: None  Social Needs  . Financial resource strain: None  . Food insecurity - worry: None  . Food insecurity - inability: None  . Transportation needs - medical: None  . Transportation needs - non-medical: None  Occupational History  . None  Tobacco Use  . Smoking status: Former Research scientist (life sciences)  . Smokeless tobacco: Never Used  Substance and Sexual Activity  . Alcohol use: Yes    Comment: daily use  . Drug use: No  . Sexual activity: Not Currently  Other Topics Concern  . None  Social History Narrative   ** Merged History Encounter **       Additional Social History:    Allergies:  No Known Allergies  Labs:  Results for orders placed or performed during the hospital encounter of 11/28/17 (from the past 48 hour(s))  Lipase, blood     Status: Abnormal   Collection Time: 11/28/17 12:07 PM  Result Value Ref Range   Lipase 123 (H) 11 - 51 U/L  Comprehensive metabolic panel     Status: Abnormal   Collection Time: 11/28/17 12:07 PM  Result Value Ref Range   Sodium 131 (L) 135 - 145 mmol/L    Comment: LYTES REPEATED SNJ/DAS   Potassium 2.9 (L) 3.5 - 5.1 mmol/L   Chloride 88 (L) 101 - 111 mmol/L   CO2 17 (L) 22 - 32 mmol/L   Glucose, Bld 219 (H) 65 - 99 mg/dL   BUN <5 (L) 6 - 20 mg/dL   Creatinine, Ser 0.97 0.61 - 1.24 mg/dL   Calcium 9.5 8.9 - 10.3 mg/dL   Total Protein 8.1 6.5 - 8.1 g/dL   Albumin 3.9 3.5 - 5.0 g/dL   AST 312 (H) 15 - 41 U/L    Comment: RESULT CONFIRMED BY MANUAL DILUTION DAS   ALT 189 (H) 17 - 63 U/L   Alkaline Phosphatase 193 (H) 38 - 126 U/L   Total Bilirubin 8.8 (H) 0.3 - 1.2 mg/dL   GFR calc non Af Amer >60 >60 mL/min   GFR calc Af Amer >60 >60 mL/min    Comment: (NOTE) The eGFR has been calculated using the CKD EPI equation. This calculation has  not been validated in all clinical situations. eGFR's persistently <60 mL/min signify possible Chronic Kidney Disease.    Anion gap 26 (H) 5 - 15  CBC     Status: Abnormal   Collection Time: 11/28/17 12:07 PM  Result Value Ref Range   WBC 5.8 3.8 - 10.6 K/uL   RBC 4.78 4.40 - 5.90 MIL/uL   Hemoglobin 15.2 13.0 - 18.0 g/dL   HCT 44.5 40.0 - 52.0 %   MCV 93.2 80.0 - 100.0 fL   MCH 31.9 26.0 - 34.0 pg   MCHC 34.2 32.0 - 36.0 g/dL  RDW 14.1 11.5 - 14.5 %   Platelets 87 (L) 150 - 440 K/uL  Hemoglobin A1c     Status: Abnormal   Collection Time: 11/28/17 12:07 PM  Result Value Ref Range   Hgb A1c MFr Bld 6.9 (H) 4.8 - 5.6 %    Comment: (NOTE) Pre diabetes:          5.7%-6.4% Diabetes:              >6.4% Glycemic control for   <7.0% adults with diabetes    Mean Plasma Glucose 151.33 mg/dL    Comment: Performed at [facility] [address], [place], [place] [address]  Magnesium     Status: Abnormal   Collection Time: 11/28/17 12:07 PM  Result Value Ref Range   Magnesium 0.9 (LL) 1.7 - 2.4 mg/dL    Comment: CRITICAL RESULT CALLED TO, READ BACK BY AND VERIFIED WITH SHEA BREWER AT 1558 ON 11/28/2017 JJB   Urinalysis, Complete w Microscopic     Status: Abnormal   Collection Time: 11/28/17 12:28 PM  Result Value Ref Range   Color, Urine AMBER (A) YELLOW   APPearance HAZY (A) CLEAR   Specific Gravity, Urine 1.035 (H) 1.005 - 1.030   pH  5.0 - 8.0    TEST NOT REPORTED DUE TO COLOR INTERFERENCE OF URINE PIGMENT   Glucose, UA (A) NEGATIVE mg/dL    TEST NOT REPORTED DUE TO COLOR INTERFERENCE OF URINE PIGMENT   Hgb urine dipstick (A) NEGATIVE    TEST NOT REPORTED DUE TO COLOR INTERFERENCE OF URINE PIGMENT   Bilirubin Urine (A) NEGATIVE    TEST NOT REPORTED DUE TO COLOR INTERFERENCE OF URINE PIGMENT   Ketones, ur (A) NEGATIVE mg/dL    TEST NOT REPORTED DUE TO COLOR INTERFERENCE OF URINE PIGMENT   Protein, ur (A) NEGATIVE mg/dL    TEST NOT REPORTED DUE TO COLOR INTERFERENCE OF  URINE PIGMENT   Nitrite (A) NEGATIVE    TEST NOT REPORTED DUE TO COLOR INTERFERENCE OF URINE PIGMENT   Leukocytes, UA (A) NEGATIVE    TEST NOT REPORTED DUE TO COLOR INTERFERENCE OF URINE PIGMENT   RBC / HPF 0-5 0 - 5 RBC/hpf   WBC, UA 6-30 0 - 5 WBC/hpf   Bacteria, UA NONE SEEN NONE SEEN   Squamous Epithelial / LPF 0-5 (A) NONE SEEN   Mucus PRESENT    Hyaline Casts, UA PRESENT    Non Squamous Epithelial 0-5 (A) NONE SEEN  Blood gas, venous     Status: Abnormal   Collection Time: 11/28/17 12:28 PM  Result Value Ref Range   pH, Ven 7.53 (H) 7.250 - 7.430   pCO2, Ven 24 (L) 44.0 - 60.0 mmHg   pO2, Ven 68.0 (H) 32.0 - 45.0 mmHg   Bicarbonate 20.1 20.0 - 28.0 mmol/L   Acid-base deficit 0.8 0.0 - 2.0 mmol/L   O2 Saturation 95.3 %   Patient temperature 37.0    Collection site VEIN    Sample type VENOUS   Protime-INR     Status: None   Collection Time: 11/28/17 12:28 PM  Result Value Ref Range   Prothrombin Time 13.4 11.4 - 15.2 seconds   INR 1.03   Ethanol     Status: None   Collection Time: 11/28/17 12:28 PM  Result Value Ref Range   Alcohol, Ethyl (B) <10 <10 mg/dL    Comment:        LOWEST DETECTABLE LIMIT FOR SERUM ALCOHOL IS 10 mg/dL  FOR MEDICAL PURPOSES ONLY   Urine Drug Screen, Qualitative ([place] only)     Status: Abnormal   Collection Time: 11/28/17 12:28 PM  Result Value Ref Range   Tricyclic, Ur Screen POSITIVE (A) NONE DETECTED   Amphetamines, Ur Screen NONE DETECTED NONE DETECTED   MDMA (Ecstasy)Ur Screen NONE DETECTED NONE DETECTED   Cocaine Metabolite,Ur [place] NONE DETECTED NONE DETECTED   Opiate, Ur Screen NONE DETECTED NONE DETECTED   Phencyclidine (PCP) Ur S NONE DETECTED NONE DETECTED   Cannabinoid 50 Ng, Ur [place] POSITIVE (A) NONE DETECTED   Barbiturates, Ur Screen NONE DETECTED NONE DETECTED   Benzodiazepine, Ur Scrn NONE DETECTED NONE DETECTED   Methadone Scn, Ur NONE DETECTED NONE DETECTED    Comment: (NOTE) 536  Tricyclics, urine               Cutoff 1000  ng/mL 200  Amphetamines, urine             Cutoff 1000 ng/mL 300  MDMA (Ecstasy), urine           Cutoff 500 ng/mL 400  Cocaine Metabolite, urine       Cutoff 300 ng/mL 500  Opiate, urine                   Cutoff 300 ng/mL 600  Phencyclidine (PCP), urine      Cutoff 25 ng/mL 700  Cannabinoid, urine              Cutoff 50 ng/mL 800  Barbiturates, urine             Cutoff 200 ng/mL 900  Benzodiazepine, urine           Cutoff 200 ng/mL 1000 Methadone, urine                Cutoff 300 ng/mL 1100 1200 The urine drug screen provides only a preliminary, unconfirmed 1300 analytical test result and should not be used for non-medical 1400 purposes. Clinical consideration and professional judgment should 1500 be applied to any positive drug screen result due to possible 1600 interfering substances. A more specific alternate chemical method 1700 must be used in order to obtain a confirmed analytical result.  1800 Gas chromato graphy / mass spectrometry (GC/MS) is the preferred 1900 confirmatory method.   HIV antibody (Routine Testing)     Status: None   Collection Time: 11/28/17  5:57 PM  Result Value Ref Range   HIV Screen 4th Generation wRfx Non Reactive Non Reactive    Comment: (NOTE) Performed At: [facility] [place], [place] [id number] Rush Farmer MD QQ:[id number]   CBC     Status: Abnormal   Collection Time: 11/29/17  4:47 AM  Result Value Ref Range   WBC 3.9 3.8 - 10.6 K/uL   RBC 4.12 (L) 4.40 - 5.90 MIL/uL   Hemoglobin 13.3 13.0 - 18.0 g/dL   HCT 38.5 (L) 40.0 - 52.0 %   MCV 93.4 80.0 - 100.0 fL   MCH 32.4 26.0 - 34.0 pg   MCHC 34.7 32.0 - 36.0 g/dL   RDW 14.3 11.5 - 14.5 %   Platelets 70 (L) 150 - 440 K/uL  Comprehensive metabolic panel     Status: Abnormal   Collection Time: 11/29/17  4:47 AM  Result Value Ref Range   Sodium 132 (L) 135 - 145 mmol/L   Potassium 2.9 (L) 3.5 - 5.1 mmol/L   Chloride 96 (L) 101 - 111 mmol/L   CO2  24 22 - 32 mmol/L    Glucose, Bld 144 (H) 65 - 99 mg/dL   BUN <5 (L) 6 - 20 mg/dL   Creatinine, Ser 0.80 0.61 - 1.24 mg/dL   Calcium 8.6 (L) 8.9 - 10.3 mg/dL   Total Protein 6.8 6.5 - 8.1 g/dL   Albumin 3.2 (L) 3.5 - 5.0 g/dL   AST 221 (H) 15 - 41 U/L   ALT 141 (H) 17 - 63 U/L   Alkaline Phosphatase 147 (H) 38 - 126 U/L   Total Bilirubin 9.4 (H) 0.3 - 1.2 mg/dL   GFR calc non Af Amer >60 >60 mL/min   GFR calc Af Amer >60 >60 mL/min    Comment: (NOTE) The eGFR has been calculated using the CKD EPI equation. This calculation has not been validated in all clinical situations. eGFR's persistently <60 mL/min signify possible Chronic Kidney Disease.    Anion gap 12 5 - 15  Lipase, blood     Status: Abnormal   Collection Time: 11/29/17  4:47 AM  Result Value Ref Range   Lipase 89 (H) 11 - 51 U/L  Magnesium     Status: None   Collection Time: 11/29/17  4:47 AM  Result Value Ref Range   Magnesium 2.0 1.7 - 2.4 mg/dL  Phosphorus     Status: Abnormal   Collection Time: 11/29/17  4:47 AM  Result Value Ref Range   Phosphorus 1.1 (L) 2.5 - 4.6 mg/dL    Comment: ICTERUS AT THIS LEVEL MAY AFFECT RESULT    Current Facility-Administered Medications  Medication Dose Route Frequency Provider Last Rate Last Dose  . 0.9 % NaCl with KCl 40 mEq / L  infusion   Intravenous Continuous Gladstone Lighter, MD 75 mL/hr at 11/29/17 1315 75 mL/hr at 11/29/17 1315  . acetaminophen (TYLENOL) tablet 650 mg  650 mg Oral Q6H PRN Dustin Flock, MD   650 mg at 11/28/17 1634   Or  . acetaminophen (TYLENOL) suppository 650 mg  650 mg Rectal Q6H PRN Dustin Flock, MD      . diltiazem (CARDIZEM) tablet 60 mg  60 mg Oral Q8H Dustin Flock, MD   60 mg at 11/29/17 1316  . enoxaparin (LOVENOX) injection 40 mg  40 mg Subcutaneous BID Dustin Flock, MD   40 mg at 11/29/17 1042  . folic acid (FOLVITE) tablet 1 mg  1 mg Oral Daily Dustin Flock, MD   1 mg at 11/29/17 1042  . haloperidol (HALDOL) 2 MG/ML solution 2 mg  2 mg Oral  Q6H PRN Gladstone Lighter, MD      . LORazepam (ATIVAN) 2 MG/ML injection           . LORazepam (ATIVAN) injection 0-4 mg  0-4 mg Intravenous Q6H Earleen Newport, MD   4 mg at 11/29/17 1429   Or  . LORazepam (ATIVAN) tablet 0-4 mg  0-4 mg Oral Q6H Earleen Newport, MD   1 mg at 11/28/17 1800  . [START ON 11/30/2017] LORazepam (ATIVAN) injection 0-4 mg  0-4 mg Intravenous Q12H Earleen Newport, MD       Or  . Derrill Memo ON 11/30/2017] LORazepam (ATIVAN) tablet 0-4 mg  0-4 mg Oral Q12H Earleen Newport, MD      . LORazepam (ATIVAN) tablet 1 mg  1 mg Oral Q6H PRN Dustin Flock, MD       Or  . LORazepam (ATIVAN) injection 1 mg  1 mg Intravenous Q6H PRN Dustin Flock, MD      .  LORazepam (ATIVAN) injection 2 mg  2 mg Intravenous Q30 min PRN Viviann Broyles T, MD      . metoprolol tartrate (LOPRESSOR) injection 5 mg  5 mg Intravenous Q4H PRN Dustin Flock, MD   5 mg at 11/29/17 0213  . multivitamin with minerals tablet 1 tablet  1 tablet Oral Daily Dustin Flock, MD   1 tablet at 11/29/17 1042  . ondansetron (ZOFRAN) tablet 4 mg  4 mg Oral Q6H PRN Dustin Flock, MD       Or  . ondansetron [facility]) injection 4 mg  4 mg Intravenous Q6H PRN Dustin Flock, MD      . oxyCODONE (Oxy IR/ROXICODONE) immediate release tablet 5 mg  5 mg Oral Q4H PRN Dustin Flock, MD   5 mg at 11/28/17 2115  . pantoprazole (PROTONIX) injection 40 mg  40 mg Intravenous Q12H Dustin Flock, MD   40 mg at 11/29/17 1041  . potassium chloride SA (K-DUR,KLOR-CON) CR tablet 40 mEq  40 mEq Oral Q4H Gladstone Lighter, MD   40 mEq at 11/29/17 1316  . thiamine (VITAMIN B-1) tablet 100 mg  100 mg Oral Daily Dustin Flock, MD   100 mg at 11/29/17 1042   Or  . thiamine (B-1) injection 100 mg  100 mg Intravenous Daily Dustin Flock, MD      . ziprasidone (GEODON) injection 20 mg  20 mg Intramuscular Once Gladstone Lighter, MD        Musculoskeletal: Strength & Muscle Tone: within normal limits Gait &  Station: unsteady Patient leans: N/A  Psychiatric Specialty Exam: Physical Exam  Nursing note and vitals reviewed. Constitutional: He appears well-developed and well-nourished.  HENT:  Head: Normocephalic and atraumatic.  Eyes: Conjunctivae are normal. Pupils are equal, round, and reactive to light.  Neck: Normal range of motion.  Cardiovascular: Regular rhythm and normal heart sounds.  Respiratory: Effort normal. No respiratory distress.  GI: Soft. There is tenderness.  Musculoskeletal: Normal range of motion.  Neurological: He is alert.  Skin: Skin is warm and dry.  Psychiatric: His mood appears anxious. His affect is labile and inappropriate. His speech is tangential. He is agitated. He is not aggressive and not combative. Thought content is paranoid. Cognition and memory are impaired. He expresses impulsivity. He expresses no homicidal and no suicidal ideation. He exhibits abnormal recent memory. He is inattentive.    Review of Systems  Constitutional: Negative.   HENT: Negative.   Eyes: Negative.   Respiratory: Negative.   Cardiovascular: Negative.   Gastrointestinal: Positive for abdominal pain and nausea.  Musculoskeletal: Negative.   Skin: Negative.   Neurological: Negative.   Psychiatric/Behavioral: Positive for memory loss and substance abuse. Negative for depression, hallucinations and suicidal ideas. The patient is nervous/anxious. The patient does not have insomnia.     Blood pressure (!) 139/54, pulse (!) 124, temperature 98.3 F (36.8 C), temperature source Oral, resp. rate 20, height 5' 6"  (1.676 m), weight (!) 320 lb (145.2 kg), SpO2 95 %.Body mass index is 51.65 kg/m.  General Appearance: Casual  Eye Contact:  Minimal  Speech:  Garbled and Slurred  Volume:  Decreased  Mood:  Anxious  Affect:  Inappropriate and Labile  Thought Process:  Disorganized  Orientation:  Negative  Thought Content:  Illogical and Tangential  Suicidal Thoughts:  No  Homicidal  Thoughts:  No  Memory:  Immediate;   Poor Recent;   Poor Remote;   Fair  Judgement:  Impaired  Insight:  Shallow  Psychomotor Activity:  Restlessness  Concentration:  Concentration: Poor  Recall:  Poor  Fund of Knowledge:  Fair  Language:  Fair  Akathisia:  No  Handed:  Right  AIMS (if indicated):     Assets:  Communication Skills Desire for Improvement Housing Social Support  ADL's:  Impaired  Cognition:  Impaired,  Mild and Moderate  Sleep:        Treatment Plan Summary: Daily contact with patient to assess and evaluate symptoms and progress in treatment, Medication management and Plan 26 year old man who currently presents with delirium.  Overwhelmingly most likely to be due to delirium tremens.  Complicated by significant medical problems from his alcohol abuse.  Patient is at high risk of worsening medical problems including death if he does not stay in the hospital for treatment.  Patient's level of consciousness prevents him from consistently holding on to this information.  Fortunately, I think largely because of the intervention of his brother, patient was able to be calm to down enough to allow for some more intramuscular Ativan and to agreed to allow an IV to be placed again.  I have put in orders for more IV Ativan with the suggestion that apart from the CIWA protocol he be given intravenous Ativan 2 mg every half hour until he is calm down and mostly sleeping.  Then he can be continued on the usual protocol.  If this is not holding him I would not hesitate to take him to the intensive care unit for Precedex treatment.  I will continue following as needed.  Question was raised about involuntary commitment.  At this point the patient is cooperative.  Because of his delirium I think that it would be completely appropriate to treat him forcibly even without an IV C but if that does become necessary I will be glad to intervene.  Disposition: Supportive therapy provided about  ongoing stressors.  Alethia Berthold, MD 11/29/2017 5:06 PM

## 2017-11-29 NOTE — Care Management Note (Signed)
Case Management Note  Patient Details  Name: Terald SleeperCalixto A Kroft MRN: [id number] Date of Birth: [birth year]  Subjective/Objective: Admitted to [facility] with alcohol dependence. Lives with brother, Ever [phone]). Last seen Dr. Hollace HaywardPlonk 03/24/17. Seen in [facility] 2 days ago. [facility] on [address], No insurance. Doesn't work. No income. A resident of [place]. Takes care of all basic activities of daily living himself, drives.  Father will transport.                   Action/Plan: Application for the [facility] and Medication Management given. Possible discharge 11/30/17 Will fax prescriptions over to Medication management when discharged   Expected Discharge Date:                  Expected Discharge Plan:     In-House Referral:   yes  Discharge planning Services     Post Acute Care Choice:    Choice offered to:     DME Arranged:    DME Agency:     HH Arranged:    HH Agency:     Status of Service:     If discussed at [employer] Meetings, dates discussed:    Additional Comments:  Gwenette GreetBrenda S Elijah Michaelis, RN MSN CCM Care Management [id number] 11/29/2017, 12:01 PM

## 2017-11-29 NOTE — Progress Notes (Signed)
[facility] - [place] at [facility]   PATIENT NAME: Luis ShirkCalixto Sledge    MR#:  [id number]  DATE OF BIRTH:  December 02, 1991  SUBJECTIVE:  CHIEF COMPLAINT:   Chief Complaint  Patient presents with  . Emesis   -Abdominal pain is much improved. Denies any nausea or vomiting. -LFTs are improving but still high. Patient feels hungry and wants to eat.   REVIEW OF SYSTEMS:  Review of Systems  Constitutional: Negative for chills, fever and malaise/fatigue.  HENT: Negative for congestion, ear discharge, hearing loss and nosebleeds.   Eyes: Negative for blurred vision and double vision.  Respiratory: Negative for cough, shortness of breath and wheezing.   Cardiovascular: Negative for chest pain, palpitations and leg swelling.  Gastrointestinal: Positive for abdominal pain and nausea. Negative for constipation, diarrhea and vomiting.  Genitourinary: Negative for dysuria.  Musculoskeletal: Negative for myalgias.  Neurological: Negative for dizziness, speech change, focal weakness, seizures and headaches.  Psychiatric/Behavioral: Negative for depression.    DRUG ALLERGIES:  No Known Allergies  VITALS:  Blood pressure (!) 139/54, pulse (!) 124, temperature 98.3 F (36.8 C), temperature source Oral, resp. rate 20, height 5\' 6"  (1.676 m), weight (!) 145.2 kg (320 lb), SpO2 95 %.  PHYSICAL EXAMINATION:  Physical Exam  GENERAL:  26 y.o.-year-old obese patient lying in the bed with no acute distress.  EYES: Pupils equal, round, reactive to light and accommodation.jaundiced sclerae. Extraocular muscles intact.  HEENT: Head atraumatic, normocephalic. Oropharynx and nasopharynx clear.  NECK:  Supple, no jugular venous distention. No thyroid enlargement, no tenderness.  LUNGS: Normal breath sounds bilaterally, no wheezing, rales,rhonchi or crepitation. No use of accessory muscles of respiration. Decreased bibasilar breath sounds CARDIOVASCULAR: S1, S2 normal. No murmurs, rubs, or  gallops.  ABDOMEN: Soft, obese,  Minimally tender in mid abdomen, nondistended. Bowel sounds present. No organomegaly or mass.  EXTREMITIES: No pedal edema, cyanosis, or clubbing.  NEUROLOGIC: Cranial nerves II through XII are intact. Muscle strength 5/5 in all extremities. Sensation intact. Gait not checked.  PSYCHIATRIC: The patient is alert and oriented x 3.  SKIN: No obvious rash, lesion, or ulcer.    LABORATORY PANEL:   CBC Recent Labs  Lab 11/29/17 0447  WBC 3.9  HGB 13.3  HCT 38.5*  PLT 70*   ------------------------------------------------------------------------------------------------------------------  Chemistries  Recent Labs  Lab 11/29/17 0447  NA 132*  K 2.9*  CL 96*  CO2 24  GLUCOSE 144*  BUN <5*  CREATININE 0.80  CALCIUM 8.6*  MG 2.0  AST 221*  ALT 141*  ALKPHOS 147*  BILITOT 9.4*   ------------------------------------------------------------------------------------------------------------------  Cardiac Enzymes No results for input(s): TROPONINI in the last 168 hours. ------------------------------------------------------------------------------------------------------------------  RADIOLOGY:  [place] Abdomen Limited Ruq  Result Date: 11/28/2017 CLINICAL DATA:  Abdominal pain and vomiting. EXAM: ULTRASOUND ABDOMEN LIMITED RIGHT UPPER QUADRANT COMPARISON:  None. FINDINGS: Habitus limited examination. Gallbladder: No gallstones or pericholecystic fluid. Borderline gallbladder wall thickening at 3-4 mm. No sonographic Murphy sign noted by sonographer. Common bile duct: Diameter: 5 mm Liver: No focal lesion identified. Increased parenchymal echogenicity. Portal vein is patent on color Doppler imaging with normal direction of blood flow towards the liver. IMPRESSION: 1. Habitus limited examination. 2. Borderline gallbladder wall thickening without sonographic findings of acute cholecystitis. 3. Hepatic steatosis. Electronically Signed   By: Awilda Metroourtnay  Bloomer  M.D.   On: 11/28/2017 15:30    EKG:   Orders placed or performed during the hospital encounter of 11/28/17  . ED EKG  . ED EKG  .  ED EKG  . ED EKG    ASSESSMENT AND PLAN:   26 year old obese male with past medical history significant for alcohol abuse presents to hospital secondary to abdominal pain, nausea and vomiting  #1 acute pancreatitis-secondary to acute alcoholic pancreatitis -Was nothing by mouth until now, continue fluids. -Clear liquid diet may be later today as his symptoms are clinically improving. -Continue to monitor for now.  abdomen with no CBD obstruction. - f/u lipase and lfts again tomorrow  #2 acute alcoholic hepatitis- appears to be jaundiced. Ultrasound abdomen with no acute cholecystitis. -Evidence of fatty liver/early changes of liver cirrhosis. -LFTs are improving slowly. Continue to monitor and follow.  #3: Alcohol Abuse-going through withdrawals. -Ativan as needed. Strongly counseled. Father at bedside.  #4 sinus tachycardia-started on Cardizem. Likely secondary to withdrawals. Monitor   #5 hypokalemia-being aggressively replaced. Recheck later today.  #6 DVT prophylaxis-Lovenox     All the records are reviewed and case discussed with Care Management/Social Workerr. Management plans discussed with the patient, family and they are in agreement.  CODE STATUS: Full Code  TOTAL TIME TAKING CARE OF THIS PATIENT: 38 minutes.   POSSIBLE D/C IN 1-2  DAYS, DEPENDING ON CLINICAL CONDITION.   Enid BaasKALISETTI,Nyja Westbrook M.D on 11/29/2017 at 12:44 PM  Between 7am to 6pm - Pager - [phone]  After 6pm go to [email or web address] - Social research officer, governmentpassword EPAS [email or web address]  [facility] [place] [facility]  Office  [phone]  CC: Primary care physician; Schuyler AmorPlonk, William, MD

## 2017-11-30 LAB — COMPREHENSIVE METABOLIC PANEL
ALT: 120 U/L — ABNORMAL HIGH (ref 17–63)
AST: 165 U/L — AB (ref 15–41)
Albumin: 3.1 g/dL — ABNORMAL LOW (ref 3.5–5.0)
Alkaline Phosphatase: 145 U/L — ABNORMAL HIGH (ref 38–126)
Anion gap: 12 (ref 5–15)
BILIRUBIN TOTAL: 9.9 mg/dL — AB (ref 0.3–1.2)
CHLORIDE: 101 mmol/L (ref 101–111)
CO2: 21 mmol/L — ABNORMAL LOW (ref 22–32)
Calcium: 8.9 mg/dL (ref 8.9–10.3)
Creatinine, Ser: 0.72 mg/dL (ref 0.61–1.24)
Glucose, Bld: 151 mg/dL — ABNORMAL HIGH (ref 65–99)
POTASSIUM: 3.3 mmol/L — AB (ref 3.5–5.1)
Sodium: 134 mmol/L — ABNORMAL LOW (ref 135–145)
TOTAL PROTEIN: 6.2 g/dL — AB (ref 6.5–8.1)

## 2017-11-30 LAB — MAGNESIUM: MAGNESIUM: 1.7 mg/dL (ref 1.7–2.4)

## 2017-11-30 LAB — LIPASE, BLOOD: LIPASE: 140 U/L — AB (ref 11–51)

## 2017-11-30 LAB — PHOSPHORUS: Phosphorus: <1 mg/dL — CL (ref 2.5–4.6)

## 2017-11-30 MED ORDER — POTASSIUM PHOSPHATES 15 MMOLE/5ML IV SOLN
30.0000 mmol | Freq: Once | INTRAVENOUS | Status: AC
  Administered 2017-11-30: 10:00:00 30 mmol via INTRAVENOUS
  Filled 2017-11-30: qty 10

## 2017-11-30 MED ORDER — FOLIC ACID 1 MG PO TABS: 1.0000 mg | ORAL_TABLET | Freq: Every day | ORAL | 0 refills | Status: DC

## 2017-11-30 MED ORDER — K-PHOS-NEUTRAL 155-852-130 MG PO TABS: 1.0000 | ORAL_TABLET | Freq: Two times a day (BID) | ORAL | 0 refills | Status: AC

## 2017-11-30 NOTE — Discharge Summary (Signed)
[facility] - [place] at [facility]   PATIENT NAME: Luis Richards    MR#:  [id number]  DATE OF BIRTH:  [birth year]  DATE OF ADMISSION:  11/28/2017 ADMITTING PHYSICIAN: Auburn BilberryShreyang Patel, MD  DATE OF DISCHARGE: 11/30/2017  PRIMARY CARE PHYSICIAN: Schuyler AmorPlonk, William, MD    ADMISSION DIAGNOSIS:  Hyperbilirubinemia [E80.6] Alcohol withdrawal syndrome without complication (HCC) [F10.230] Acute pancreatitis without infection or necrosis, unspecified pancreatitis type [K85.90]  DISCHARGE DIAGNOSIS:  Principal Problem:   Delirium tremens (HCC) Active Problems:   Alcohol abuse   Alcohol dependence with withdrawal (HCC)   Alcoholic hepatitis   Pancreatitis, acute   SECONDARY DIAGNOSIS:   Past Medical History:  Diagnosis Date  . Alcohol abuse   . Fatty liver   . Morbid obesity (HCC)   . Vitamin D deficiency     HOSPITAL COURSE:   26 year old male with EtOH abuse who presented with abdominal pain and found to have acute pancreatitis.  1. Acute pancreatitis due to EtOH abuse Patient without abdominal pain, nausea or vomiting Tolerating diet  2. Elevated liver function test due to acute alcoholic hepatitis with icteric sclera and mild jaundice which is improving Ultrasound of the abdomen shows no acute cholecystitis it does show evidence of early changes of liver cirrhosis. LFTs are improving  3. EtOH abuse: Patient was on CIWA protocol. Psychiatry was consulted He is encouraged to stop EtOH abuse  4. Sinus tachycardia: This is due to EtOH withdrawal and I will not discharge patient with medications at this time his heart rate is better controlled  5. Electrolyte abnormalities due to poor nutrition and EtOH abuse These were repleted prior to discharge    DISCHARGE CONDITIONS AND DIET:   Stable for discharge on regular diet  CONSULTS OBTAINED:  Treatment Team:  Audery Amellapacs, John T, MD  DRUG ALLERGIES:  No Known Allergies  DISCHARGE MEDICATIONS:    Allergies as of 11/30/2017   No Known Allergies     Medication List    TAKE these medications   folic acid 1 MG tablet Commonly known as:  FOLVITE Take 1 tablet (1 mg total) by mouth daily. Start taking on:  12/01/2017   K-PHOS-NEUTRAL [phone] MG Tabs Take 1 tablet by mouth 2 (two) times daily for 3 days.         Today   CHIEF COMPLAINT:  Patient and family report that his symptoms have improved   VITAL SIGNS:  Blood pressure 131/70, pulse (!) 123, temperature 98 F (36.7 C), temperature source Oral, resp. rate 16, height 5\' 6"  (1.676 m), weight (!) 145.2 kg (320 lb), SpO2 97 %.   REVIEW OF SYSTEMS:  Review of Systems  Constitutional: Negative.  Negative for chills, fever and malaise/fatigue.  HENT: Negative.  Negative for ear discharge, ear pain, hearing loss, nosebleeds and sore throat.   Eyes: Negative.  Negative for blurred vision and pain.  Respiratory: Negative.  Negative for cough, hemoptysis, shortness of breath and wheezing.   Cardiovascular: Negative.  Negative for chest pain, palpitations and leg swelling.  Gastrointestinal: Negative.  Negative for abdominal pain, blood in stool, diarrhea, nausea and vomiting.  Genitourinary: Negative.  Negative for dysuria.  Musculoskeletal: Negative.  Negative for back pain.  Skin: Negative.   Neurological: Negative for dizziness, tremors, speech change, focal weakness, seizures and headaches.  Endo/Heme/Allergies: Negative.  Does not bruise/bleed easily.  Psychiatric/Behavioral: Positive for substance abuse. Negative for depression, hallucinations and suicidal ideas.     PHYSICAL EXAMINATION:  GENERAL:  26 y.o.-year-old patient lying  in the bed with no acute distress.  Sclera are icteric NECK:  Supple, no jugular venous distention. No thyroid enlargement, no tenderness.  LUNGS: Normal breath sounds bilaterally, no wheezing, rales,rhonchi  No use of accessory muscles of respiration.  CARDIOVASCULAR: S1, S2  normal. No murmurs, rubs, or gallops.  ABDOMEN: Soft, non-tender, non-distended. Bowel sounds present. No organomegaly or mass.  EXTREMITIES: No pedal edema, cyanosis, or clubbing.  PSYCHIATRIC: The patient is alert and oriented x 3.  SKIN: No obvious rash, lesion, or ulcer.   DATA REVIEW:   CBC Recent Labs  Lab 11/29/17 0447  WBC 3.9  HGB 13.3  HCT 38.5*  PLT 70*    Chemistries  Recent Labs  Lab 11/30/17 0514  NA 134*  K 3.3*  CL 101  CO2 21*  GLUCOSE 151*  BUN <5*  CREATININE 0.72  CALCIUM 8.9  MG 1.7  AST 165*  ALT 120*  ALKPHOS 145*  BILITOT 9.9*    Cardiac Enzymes No results for input(s): TROPONINI in the last 168 hours.  Microbiology Results  @[email or web address] @  RADIOLOGY:  [place] Abdomen Limited Ruq  Result Date: 11/28/2017 CLINICAL DATA:  Abdominal pain and vomiting. EXAM: ULTRASOUND ABDOMEN LIMITED RIGHT UPPER QUADRANT COMPARISON:  None. FINDINGS: Habitus limited examination. Gallbladder: No gallstones or pericholecystic fluid. Borderline gallbladder wall thickening at 3-4 mm. No sonographic Murphy sign noted by sonographer. Common bile duct: Diameter: 5 mm Liver: No focal lesion identified. Increased parenchymal echogenicity. Portal vein is patent on color Doppler imaging with normal direction of blood flow towards the liver. IMPRESSION: 1. Habitus limited examination. 2. Borderline gallbladder wall thickening without sonographic findings of acute cholecystitis. 3. Hepatic steatosis. Electronically Signed   By: Awilda Metroourtnay  Bloomer M.D.   On: 11/28/2017 15:30      Allergies as of 11/30/2017   No Known Allergies     Medication List    TAKE these medications   folic acid 1 MG tablet Commonly known as:  FOLVITE Take 1 tablet (1 mg total) by mouth daily. Start taking on:  12/01/2017   K-PHOS-NEUTRAL [phone] MG Tabs Take 1 tablet by mouth 2 (two) times daily for 3 days.        Management plans discussed with the patient and he is in agreement. Stable  for discharge   Patient should follow up with pcp  CODE STATUS:     Code Status Orders  (From admission, onward)        Start     Ordered   11/28/17 1602  Full code  Continuous     11/28/17 1601    Code Status History    Date Active Date Inactive Code Status Order ID Comments User Context   11/28/2017 12:56 11/28/2017 16:01 Full Code [id number]  Emily FilbertWilliams, Jonathan E, MD ED      TOTAL TIME TAKING CARE OF THIS PATIENT: 37 minutes.    Note: This dictation was prepared with Dragon dictation along with smaller phrase technology. Any transcriptional errors that result from this process are unintentional.  Makinley Muscato M.D on 11/30/2017 at 10:51 AM  Between 7am to 6pm - Pager - [phone] After 6pm go to [email or web address] - Social research officer, governmentpassword EPAS [email or web address]  [facility] [place] [facility]  Office  [phone]  CC: Primary care physician; Schuyler AmorPlonk, William, MD

## 2017-11-30 NOTE — Progress Notes (Signed)
Pt is being discharged today, IV x1 removed, all belongings packed and returned to the patient. Discharge instructions reviewed with the patient and his brother, they both verified understanding. 2 paper prescriptions were given to the patient. He refused a wheelchair, his gait is steady. He walked with family and staff to the visitors entrance.

## 2017-11-30 NOTE — Progress Notes (Signed)
MEDICATION RELATED CONSULT NOTE - INITIAL   Pharmacy Consult for electrolyte management Indication: hypokalemia  No Known Allergies  Patient Measurements: Height: 5\' 6"  (167.6 cm) Weight: (!) 320 lb (145.2 kg) IBW/kg (Calculated) : 63.8 Adjusted Body Weight:   Vital Signs: Temp: 98 F (36.7 C) (12/05 0436) Temp Source: Oral (12/05 0436) BP: 131/70 (12/05 0436) Pulse Rate: 123 (12/05 0436) Intake/Output from previous day: 12/04 0701 - 12/05 0700 In: 720 [P.O.:720] Out: -  Intake/Output from this shift: No intake/output data recorded.  Labs: Recent Labs    11/28/17 1207 11/29/17 0447 11/30/17 0514  WBC 5.8 3.9  --   HGB 15.2 13.3  --   HCT 44.5 38.5*  --   PLT 87* 70*  --   CREATININE 0.97 0.80 0.72  MG 0.9* 2.0 1.7  PHOS  --  1.1* <1.0*  ALBUMIN 3.9 3.2* 3.1*  PROT 8.1 6.8 6.2*  AST 312* 221* 165*  ALT 189* 141* 120*  ALKPHOS 193* 147* 145*  BILITOT 8.8* 9.4* 9.9*   Estimated Creatinine Clearance: 189.1 mL/min (by C-G formula based on SCr of 0.72 mg/dL).   Microbiology: No results found for this or any previous visit (from the past 720 hour(s)).  Medical History: Past Medical History:  Diagnosis Date  . Alcohol abuse   . Fatty liver   . Morbid obesity (HCC)   . Vitamin D deficiency     Medications:  Infusions:  . 0.9 % NaCl with KCl 40 mEq / L 75 mL/hr (11/30/17 0441)  . potassium PHOSPHATE IVPB (mmol)      Assessment: 26 yom cc emesis with delirium tremens and electrolyte abnormalities. Pharmacy consulted to manage electrolytes. Patient is currently on NS with KCl 40 mEq/L at 75 mL/hr to supply approximately 72 mEq KCl per day.   11/30/17 0514 K 3.3, Ca 8.9, albumin 3.1, adjusted Ca 9.6, Mg 1.7, phos <1  Goal of Therapy:  K 3.5 to 5 Ca 8.9 to 10.3 Mg 1.7 to 2.4 Phos 2.5 to 4.6  Plan: Potassium phosphate 30 mmol IV x 1. Will recheck BMP and phos this evening, and all electrolytes tomorrow with AM labs. This patient is able to take oral meds  and has a diet ordered, but due to Phos <1 we need to use IV replacement. Will use oral when able.  Carola FrostNathan A Genevive Printup, Pharm.D., BCPS Clinical Pharmacist 11/30/2017,9:21 AM

## 2018-12-02 IMAGING — US US ABDOMEN LIMITED
1 series · 14 of 25 positions shown · non-contrast
Comparison: None.

CLINICAL DATA: Abdominal pain and vomiting.

EXAM:
ULTRASOUND ABDOMEN LIMITED RIGHT UPPER QUADRANT

[Series 1: us abdomen limited · 0.33mm/px · 14 of 41 slices shown]
[im 1/41]
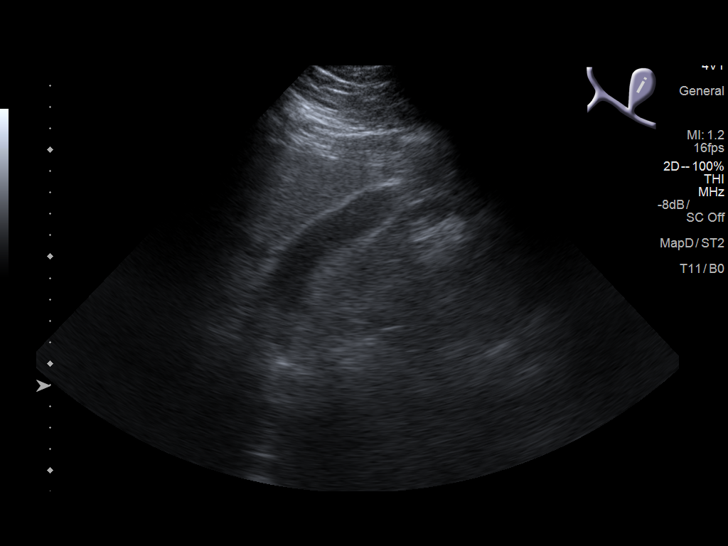
[im 4/41]
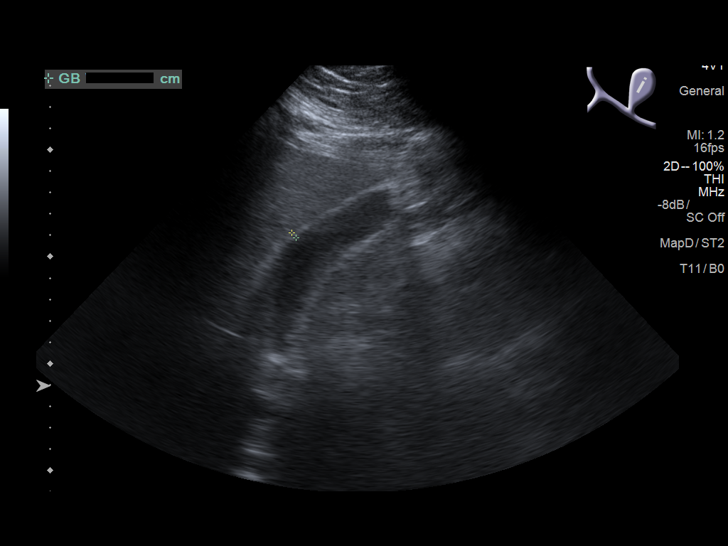
[im 7/41]
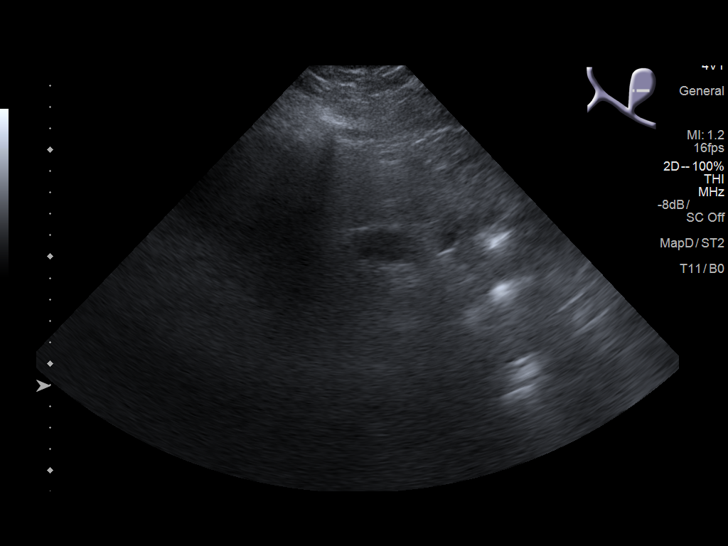
[im 11/41]
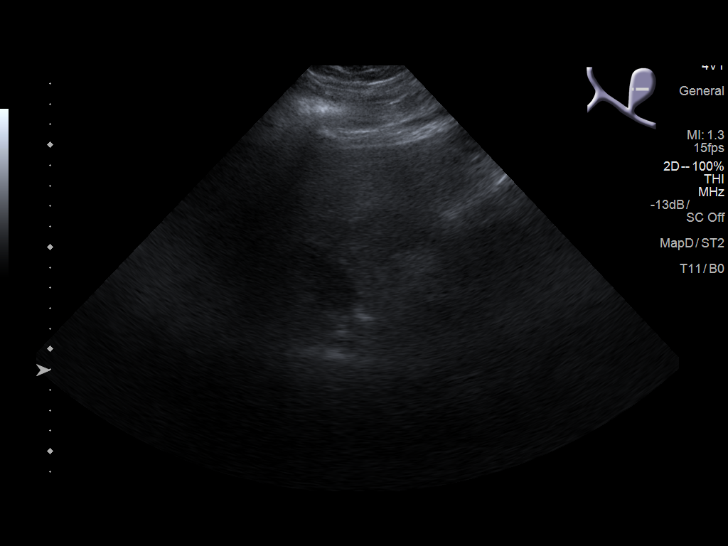
[im 14/41]
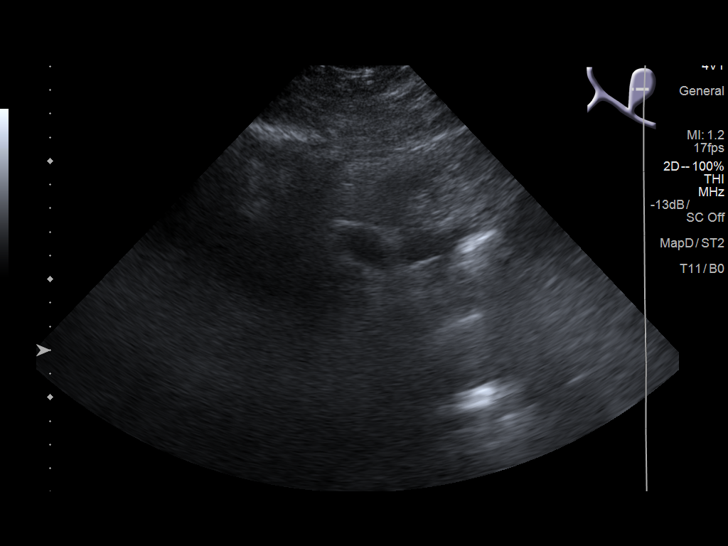
[im 16/41]
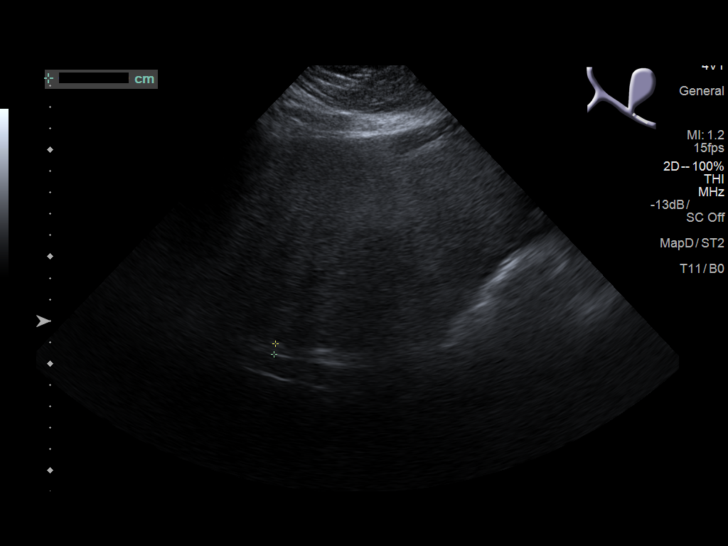
[im 19/41]
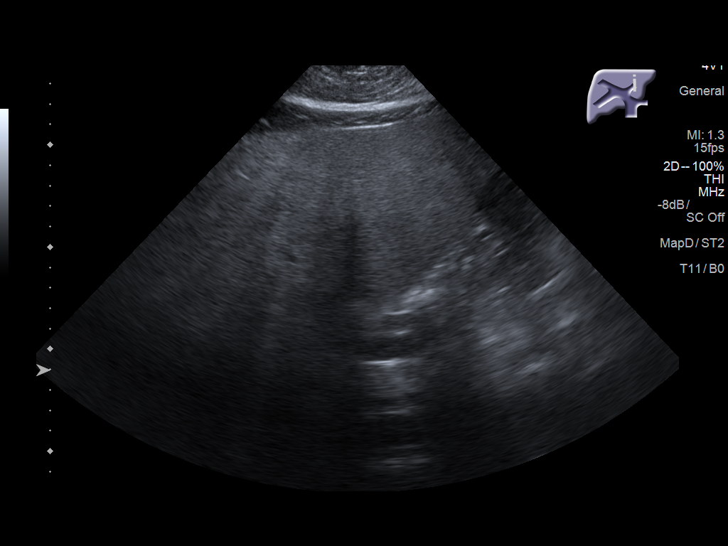
[im 22/41]
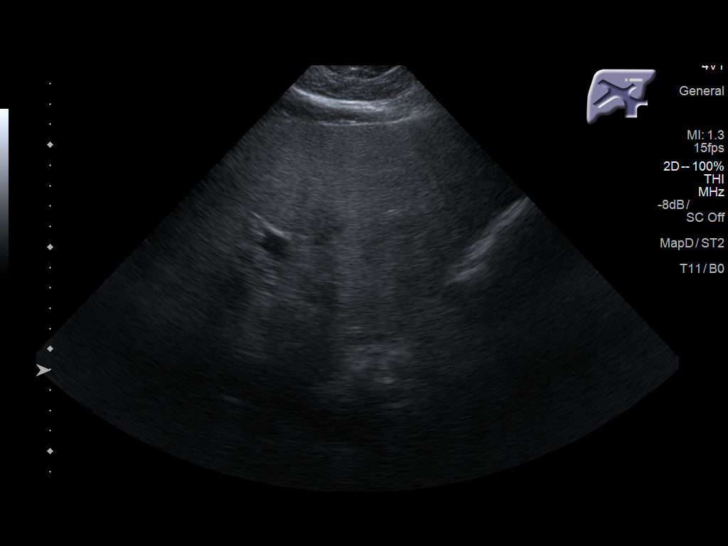
[im 26/41]
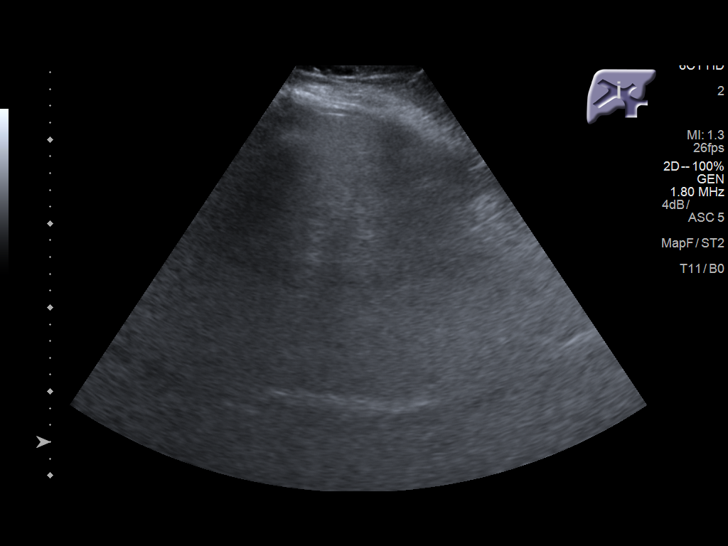
[im 27/41]
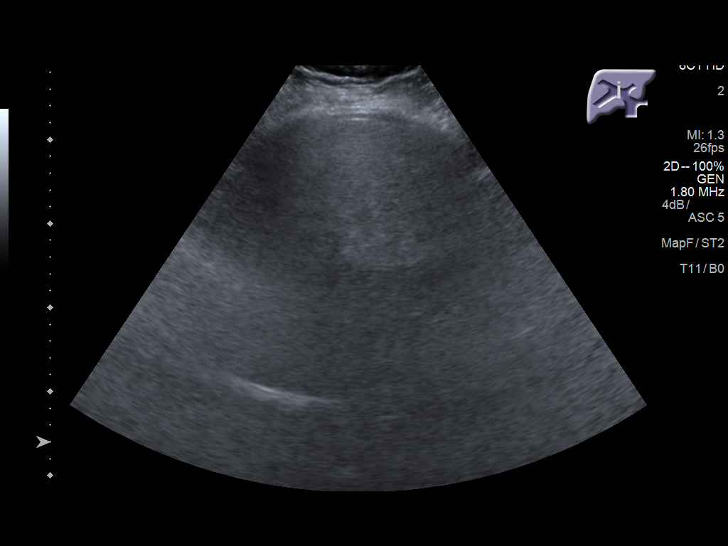
[im 31/41]
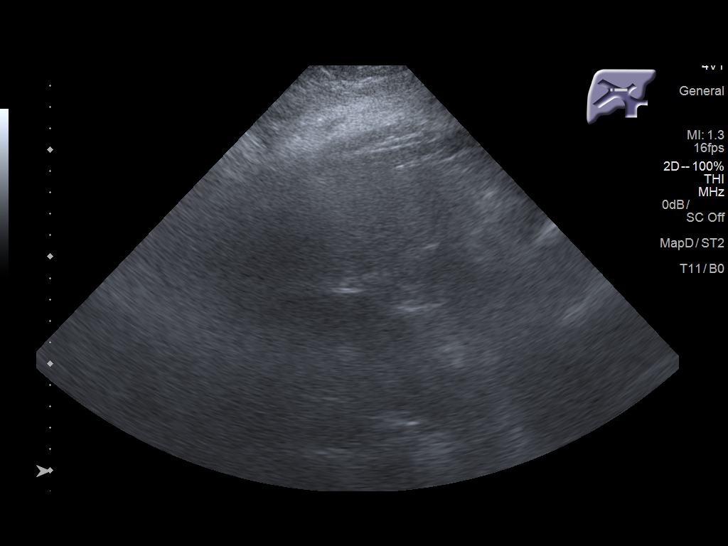
[im 34/41]
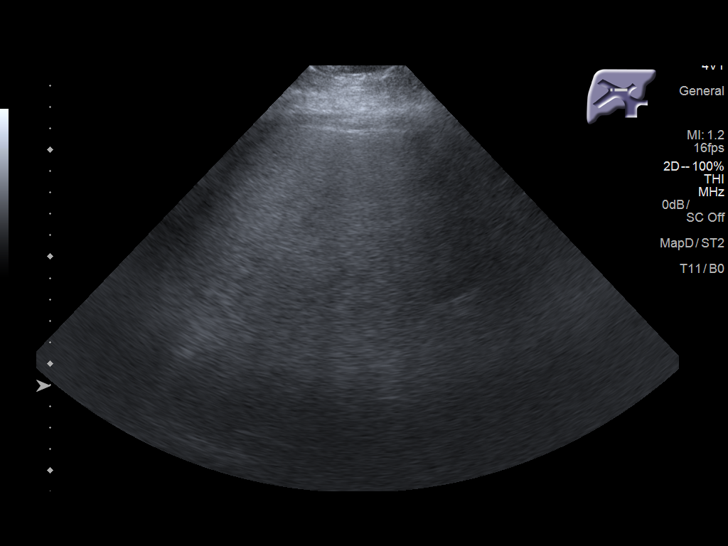
[im 37/41]
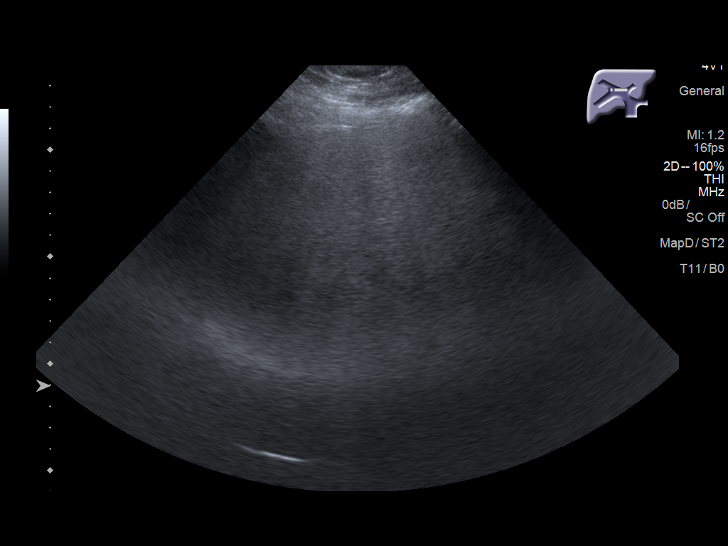
[im 41/41]
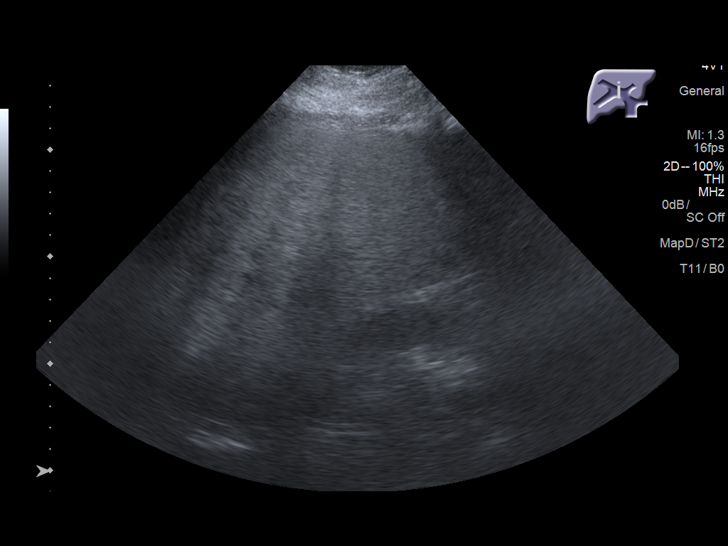

[14 of 25 positions shown; findings below may reference images not displayed]

FINDINGS: Habitus limited examination.

Gallbladder:

No gallstones or pericholecystic fluid. Borderline gallbladder wall
thickening at 3-4 mm. No sonographic Murphy sign noted by
sonographer.

Common bile duct:

Diameter: 5 mm

Liver:

No focal lesion identified. Increased parenchymal echogenicity.
Portal vein is patent on color Doppler imaging with normal direction
of blood flow towards the liver.
IMPRESSION: 1. Habitus limited examination.
2. Borderline gallbladder wall thickening without sonographic
findings of acute cholecystitis.
3. Hepatic steatosis.

## 2019-12-12 ENCOUNTER — Other Ambulatory Visit: Payer: Self-pay

## 2019-12-12 ENCOUNTER — Emergency Department
Admission: EM | Admit: 2019-12-12 | Discharge: 2019-12-12 | Disposition: A | Discharge status: Home / Self Care | Payer: 59 | Attending: Emergency Medicine | Admitting: Emergency Medicine

## 2019-12-12 DIAGNOSIS — Z87891 Personal history of nicotine dependence: Secondary | ICD-10-CM | POA: Diagnosis not present

## 2019-12-12 DIAGNOSIS — M5442 Lumbago with sciatica, left side: Secondary | ICD-10-CM | POA: Insufficient documentation

## 2019-12-12 DIAGNOSIS — M545 Low back pain: Secondary | ICD-10-CM | POA: Diagnosis present

## 2019-12-12 LAB — URINALYSIS, ROUTINE W REFLEX MICROSCOPIC
Bilirubin Urine: NEGATIVE
Glucose, UA: NEGATIVE mg/dL
Hgb urine dipstick: NEGATIVE
Ketones, ur: NEGATIVE mg/dL
Leukocytes,Ua: NEGATIVE
Nitrite: NEGATIVE
Protein, ur: NEGATIVE mg/dL
Specific Gravity, Urine: 1.023 (ref 1.005–1.030)
pH: 6 (ref 5.0–8.0)

## 2019-12-12 MED ORDER — LIDOCAINE 5 % EX PTCH
1.0000 | MEDICATED_PATCH | CUTANEOUS | Status: DC
  Administered 2019-12-12: 1 via TRANSDERMAL
  Filled 2019-12-12: qty 1

## 2019-12-12 MED ORDER — CYCLOBENZAPRINE HCL 10 MG PO TABS
5.0000 mg | ORAL_TABLET | Freq: Once | ORAL | Status: DC
  Filled 2019-12-12: qty 1

## 2019-12-12 MED ORDER — LIDOCAINE 5 % EX PTCH: 1.0000 | MEDICATED_PATCH | Freq: Two times a day (BID) | CUTANEOUS | 0 refills | Status: DC

## 2019-12-12 MED ORDER — CYCLOBENZAPRINE HCL 5 MG PO TABS: 5.0000 mg | ORAL_TABLET | Freq: Three times a day (TID) | ORAL | 0 refills | Status: DC | PRN

## 2019-12-12 MED ORDER — IBUPROFEN 600 MG PO TABS
600.0000 mg | ORAL_TABLET | Freq: Once | ORAL | Status: AC
  Administered 2019-12-12: 600 mg via ORAL
  Filled 2019-12-12: qty 1

## 2019-12-12 NOTE — ED Notes (Addendum)
Pt c/o left sided back pain that started yesterday morning not related to heavy lifting. Reports having difficulty walking this morning d/t pain. Ambulatory to treatment room with NAD noted. Reports no difficulty with urination or bowels.

## 2019-12-12 NOTE — Discharge Instructions (Signed)

## 2019-12-12 NOTE — ED Triage Notes (Signed)
Pt to the er for back pain. Pt states he woke up this morning with severe pain. Pt ambulatory with a slow pace. No hx of back issues.

## 2019-12-12 NOTE — ED Provider Notes (Signed)
[facility] Emergency Department Provider Note  ____________________________________________   First MD Initiated Contact with Patient 12/12/19 0402     (approximate)  I have reviewed the triage vital signs and the nursing notes.   HISTORY  Chief Complaint Back Pain    HPI Luis Richards is a 28 y.o. male who is generally healthy except as listed below.  He presents by private vehicle for evaluation of acute onset back pain that occurred about 26 hours ago.  He said that it started in the left part of his mid to lower back and was sharp and radiated all the way down his leg, mostly on the left but little bit on the right as well.  Nothing particular made it better and it felt little bit worse when he is walking but he was able to walk without difficulty.  He went ahead and went to work and worked all day and said that it seemed little bit better because he was not thinking about it.  However tonight it was again worse and bothering him a lot while trying to sleep so he came to the ED.  He says the pain is severe and sharp.  He currently has no numbness nor tingling and it is not radiating down his legs even though it did before.  He has been going to the bathroom normally over the course of the last 24 hours since the onset of the pain, no urinary retention or incontinence, no constipation or fecal incontinence.  He has not observed any hematuria and he has not had any dysuria.  He denies fever/chills, sore throat, chest pain, shortness of breath, nausea, vomiting, and abdominal pain.  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         Past Medical History:  Diagnosis Date  . Alcohol abuse   . Fatty liver   . Morbid obesity ([place])   . Vitamin D deficiency     Patient Active Problem List   Diagnosis Date Noted  . Delirium tremens  ([place]) 11/29/2017  . Alcoholic hepatitis 62/83/1517  . Pancreatitis, acute 11/29/2017  . Alcohol abuse 11/28/2017  . Alcohol dependence with withdrawal ([place]) 11/28/2017  . Vitamin D deficiency 03/24/2016  . Fatty liver 07/22/2015  . Smoker 07/22/2015  . Obesity (BMI 30-39.9) 07/22/2015    Past Surgical History:  Procedure Laterality Date  . APPENDECTOMY      Prior to Admission medications   Medication Sig Start Date End Date Taking? Authorizing Provider  cyclobenzaprine (FLEXERIL) 5 MG tablet Take 1 tablet (5 mg total) by mouth 3 (three) times daily as needed for muscle spasms. 12/12/19   Hinda Kehr, MD  folic acid (FOLVITE) 1 MG tablet Take 1 tablet (1 mg total) by mouth daily. 12/01/17   Bettey Costa, MD  lidocaine (LIDODERM) 5 % Place 1 patch onto the skin every 12 (twelve) hours. Remove & Discard patch within 12 hours or as directed by MD.  Pershing Proud the patch off for 12 hours before applying a new one. 12/12/19 12/11/20  Hinda Kehr, MD    Allergies Patient has no known allergies.  Family History  Problem Relation Age of Onset  . Diabetes Father   . Diabetes Paternal Aunt   . Diabetes Paternal Uncle     Social History Social History   Tobacco Use  .  Smoking status: Former Games developer  . Smokeless tobacco: Never Used  Substance Use Topics  . Alcohol use: Yes    Comment: daily use  . Drug use: No    Review of Systems Constitutional: No fever/chills Cardiovascular: Denies chest pain. Respiratory: Denies shortness of breath. Gastrointestinal: No abdominal pain.  No nausea, no vomiting.  No diarrhea.  No constipation. no fecal incontinence. Genitourinary: Negative for dysuria and dysuria.  No urinary retention nor incontinence. Musculoskeletal: Acute onset mid to lower left-sided back pain. Integumentary: Negative for rash. Neurological: Pain from his back occasionally radiates down the left leg.    ____________________________________________   PHYSICAL  EXAM:  VITAL SIGNS: ED Triage Vitals  Enc Vitals Group     BP 12/12/19 0339 (!) 144/93     Pulse Rate 12/12/19 0339 (!) 122     Resp 12/12/19 0339 17     Temp 12/12/19 0339 98.4 F (36.9 C)     Temp Source 12/12/19 0339 Oral     SpO2 12/12/19 0339 97 %     Weight 12/12/19 0340 (!) 158.8 kg (350 lb)     Height 12/12/19 0340 1.702 m (5\' 7" )     Head Circumference --      Peak Flow --      Pain Score 12/12/19 0340 7     Pain Loc --      Pain Edu? --      Excl. in GC? --     Constitutional: Alert and oriented.  No acute distress. Eyes: Conjunctivae are normal.  Head: Atraumatic. Cardiovascular: Normal rate, regular rhythm.  Respiratory: Normal respiratory effort.  No retractions. Musculoskeletal: Patient has left-sided CVA tenderness to percussion.  He has some tenderness to palpation of the paraspinal muscles in his lower thoracic and lumbar spine primarily on the left side.  No step-offs or deformities noted and no bony tenderness specifically along the spine. Neurologic:  Normal speech and language. No gross focal neurologic deficits are appreciated.  Patient is ambulatory, somewhat slowly but without a limp. Skin:  Skin is warm, dry and intact. Psychiatric: Mood and affect are normal. Speech and behavior are normal.  ____________________________________________   LABS (all labs ordered are listed, but only abnormal results are displayed)  Labs Reviewed  URINALYSIS, ROUTINE W REFLEX MICROSCOPIC - Abnormal; Notable for the following components:      Result Value   Color, Urine YELLOW (*)    APPearance CLEAR (*)    All other components within normal limits   ____________________________________________  EKG  No indication for EKG ____________________________________________  RADIOLOGY I, 12/14/19, personally viewed and evaluated these images (plain radiographs) as part of my medical decision making, as well as reviewing the written report by the radiologist.  ED  MD interpretation:  No indication for emergent imaging  Official radiology report(s): No results found.  ____________________________________________   PROCEDURES   Procedure(s) performed (including Critical Care):  Procedures   ____________________________________________   INITIAL IMPRESSION / MDM / ASSESSMENT AND PLAN / ED COURSE  As part of my medical decision making, I reviewed the following data within the electronic MEDICAL RECORD NUMBER Nursing notes reviewed and incorporated, Labs reviewed , Old chart reviewed, Notes from prior ED visits and [place] Controlled Substance Database   Differential diagnosis includes, but is not limited to, muscle strain and inflammation leading to sciatica, bulging disc, kidney/ureteral stone with colic, UTI or pyelonephritis; less likely diagnoses include cauda equina syndrome, osteomyelitis/discitis, transverse myelitis, epidural abscess.  The patient is well-appearing  and in no distress.  He was tachycardic upon arrival but he has a large body habitus and his vital signs were taken immediately upon ambulating to triage.  Once he sat down to rest his heart rate came down to normal or near normal when I evaluated him.  He is in no distress but appears a little bit uncomfortable.  He has tenderness to percussion in the left CVA region.  I discussed various options for working up his symptoms.  I think most likely he has strained the muscles in his back with or without a possible disc injury given the symptoms that sound like sciatica.  He has no focal neurological deficits at this time and no history of urinary retention or incontinence.  I think it is unlikely that he is having an emergent neurological or neurosurgical issue.  Ureteral colic is possible but does not explain his sciatica.  I am reluctant to order a CT scan with all the radiation and expense if the suspicion for stones, particular stones requiring intervention, is very low.  I explained all of  this to the patient.  The current plan is to check a urinalysis.  If there is hemoglobinuria or hematuria we will proceed with a CT scan renal stone protocol.  If not, we will treat for presumed muscular strain and I will provide follow-up recommendations.  He agrees with the plan.      Clinical Course as of Dec 11 705  Wed Dec 12, 2019  0529 No hematuria on UA.  Will proceed with lidoderm patch and Flexeril.  Gave usual/customary return precautions.  Urinalysis, Routine w reflex microscopic(!) [CF]    Clinical Course User Index [CF] Loleta RoseForbach, Bunyan Brier, MD     ____________________________________________  FINAL CLINICAL IMPRESSION(S) / ED DIAGNOSES  Final diagnoses:  Acute left-sided low back pain with left-sided sciatica     MEDICATIONS GIVEN DURING THIS VISIT:  Medications  cyclobenzaprine (FLEXERIL) tablet 5 mg (5 mg Oral Refused 12/12/19 0549)  lidocaine (LIDODERM) 5 % 1 patch (1 patch Transdermal Patch Applied 12/12/19 0547)  ibuprofen (ADVIL) tablet 600 mg (600 mg Oral Given 12/12/19 0547)     ED Discharge Orders         Ordered    lidocaine (LIDODERM) 5 %  Every 12 hours     12/12/19 0531    cyclobenzaprine (FLEXERIL) 5 MG tablet  3 times daily PRN     12/12/19 0531          *Please note:  Luis Richards was evaluated in Emergency Department on 12/12/2019 for the symptoms described in the history of present illness. He was evaluated in the context of the global COVID-19 pandemic, which necessitated consideration that the patient might be at risk for infection with the SARS-CoV-2 virus that causes COVID-19. Institutional protocols and algorithms that pertain to the evaluation of patients at risk for COVID-19 are in a state of rapid change based on information released by regulatory bodies including the CDC and federal and state organizations. These policies and algorithms were followed during the patient's care in the ED.  Some ED evaluations and interventions may be  delayed as a result of limited staffing during the pandemic.*  Note:  This document was prepared using Dragon voice recognition software and may include unintentional dictation errors.   Loleta RoseForbach, Tytiana Coles, MD 12/12/19 [phone]

## 2020-05-02 ENCOUNTER — Ambulatory Visit: Payer: 59 | Admitting: Podiatry

## 2020-05-02 ENCOUNTER — Ambulatory Visit (INDEPENDENT_AMBULATORY_CARE_PROVIDER_SITE_OTHER): Payer: 59

## 2020-05-02 ENCOUNTER — Encounter: Payer: Self-pay | Admitting: *Deleted

## 2020-05-02 ENCOUNTER — Other Ambulatory Visit: Payer: Self-pay

## 2020-05-02 DIAGNOSIS — M7671 Peroneal tendinitis, right leg: Secondary | ICD-10-CM | POA: Diagnosis not present

## 2020-05-02 DIAGNOSIS — S92351G Displaced fracture of fifth metatarsal bone, right foot, subsequent encounter for fracture with delayed healing: Secondary | ICD-10-CM | POA: Diagnosis not present

## 2020-05-02 MED ORDER — MELOXICAM 15 MG PO TABS: 15.0000 mg | ORAL_TABLET | Freq: Every day | ORAL | 1 refills | Status: DC

## 2020-05-09 NOTE — Progress Notes (Signed)
   HPI: 29 y.o. male presenting today as a new patient for evaluation of an injury that occurred on 10/08/2019.  Patient states he tripped over his dog and he thought he had rolled his ankle.  He continued to experience sharp pain with throbbing.  Is been going on now for approximately 7 months and he has been experiencing swelling with pain.  The patient works standing and steel toed boots.  He is been taking Aleve and resting with ice compression and elevation.  He presents for further treatment evaluation  Past Medical History:  Diagnosis Date  . Alcohol abuse   . Fatty liver   . Morbid obesity ([facility])   . Vitamin D deficiency      Physical Exam: General: The patient is alert and oriented x3 in no acute distress.  Dermatology: Skin is warm, dry and supple bilateral lower extremities. Negative for open lesions or macerations.  Vascular: Palpable pedal pulses bilaterally. No edema or erythema noted. Capillary refill within normal limits.  Neurological: Epicritic and protective threshold grossly intact bilaterally.   Musculoskeletal Exam: Range of motion within normal limits to all pedal and ankle joints bilateral. Muscle strength 5/5 in all groups bilateral.   Radiographic Exam:  Normal osseous mineralization. Fracture noted to the fifth metatarsal base/tubercle of the right foot extending into the metatarsal cuboid joint.  Mildly displaced  Assessment: 1.  Fracture fifth metatarsal base, intra-articular right foot with nonunion 2.  Insertional peroneal tendinitis   Plan of Care:  1. Patient evaluated. X-Rays reviewed.  2.  Cam boot dispensed.  Weightbearing as tolerated in the cam boot 3.  Prescription for meloxicam 4.  Injection of 0.5 cc Celestone Soluspan injected along the peroneal tendon sheath right 5.  Recommend refraining from work x4 weeks.  Patient is going to request short-term disability from work 6.  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 Felecia Shelling, DPM    [address] [place], [place] [address]                Office [phone]  Fax [phone]

## 2020-05-13 ENCOUNTER — Telehealth: Payer: Self-pay | Admitting: *Deleted

## 2020-05-13 NOTE — Telephone Encounter (Signed)
I faxed the FMLA forms to the [employer].  I called and left a message for the patient informing him the forms had been sent.

## 2020-05-16 ENCOUNTER — Telehealth: Payer: Self-pay | Admitting: *Deleted

## 2020-05-16 NOTE — Telephone Encounter (Signed)
FMLA paperwork complete 

## 2020-05-30 ENCOUNTER — Ambulatory Visit (INDEPENDENT_AMBULATORY_CARE_PROVIDER_SITE_OTHER): Payer: 59

## 2020-05-30 ENCOUNTER — Ambulatory Visit: Payer: 59 | Admitting: Podiatry

## 2020-05-30 ENCOUNTER — Encounter: Payer: Self-pay | Admitting: *Deleted

## 2020-05-30 ENCOUNTER — Other Ambulatory Visit: Payer: Self-pay

## 2020-05-30 ENCOUNTER — Encounter: Payer: Self-pay | Admitting: Podiatry

## 2020-05-30 DIAGNOSIS — S92351G Displaced fracture of fifth metatarsal bone, right foot, subsequent encounter for fracture with delayed healing: Secondary | ICD-10-CM

## 2020-05-30 DIAGNOSIS — M7671 Peroneal tendinitis, right leg: Secondary | ICD-10-CM | POA: Diagnosis not present

## 2020-05-30 MED ORDER — MELOXICAM 15 MG PO TABS: 15.0000 mg | ORAL_TABLET | Freq: Every day | ORAL | 1 refills | Status: DC

## 2020-06-01 NOTE — Progress Notes (Signed)
   HPI: 29 y.o. male presenting today as a new patient for evaluation of an injury that occurred on 10/08/2019.  Patient states that he is feeling much better.  He is ready go back to work.  He states that the injection he received last visit helped significantly.  He is able to do more daily activities without pain.  He presents for further treatment evaluation  Past Medical History:  Diagnosis Date  . Alcohol abuse   . Fatty liver   . Morbid obesity ([facility])   . Vitamin D deficiency      Physical Exam: General: The patient is alert and oriented x3 in no acute distress.  Dermatology: Skin is warm, dry and supple bilateral lower extremities. Negative for open lesions or macerations.  Vascular: Palpable pedal pulses bilaterally. No edema or erythema noted. Capillary refill within normal limits.  Neurological: Epicritic and protective threshold grossly intact bilaterally.   Musculoskeletal Exam: Range of motion within normal limits to all pedal and ankle joints bilateral. Muscle strength 5/5 in all groups bilateral.  There is improved pain on palpation along the peroneal tendons of the right foot.  Radiographic Exam:  Normal osseous mineralization. Fracture noted to the fifth metatarsal base/tubercle of the right foot extending into the metatarsal cuboid joint.  Mildly displaced  Assessment: 1.  Fracture fifth metatarsal base, intra-articular right foot with nonunion 2.  Insertional peroneal tendinitis-improved   Plan of Care:  1. Patient evaluated. X-Rays reviewed.  2.  Refill prescription for meloxicam 15 mg daily.  Continue as needed 3.  Transition out of the cam boot into good supportive tennis shoes 4.  I explained to the patient that he may always have the nonunion to the fifth metatarsal.  As long it is is asymptomatic we will continue to observe and treat conservatively 5.  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 Felecia Shelling, DPM    [address] [place], [place] [address]                Office [phone]  Fax [phone]

## 2020-07-03 ENCOUNTER — Other Ambulatory Visit: Payer: Self-pay

## 2020-07-03 ENCOUNTER — Encounter (HOSPITAL_COMMUNITY): Payer: Self-pay | Admitting: Emergency Medicine

## 2020-07-03 ENCOUNTER — Emergency Department (HOSPITAL_COMMUNITY)
Admission: EM | Admit: 2020-07-03 | Discharge: 2020-07-03 | Disposition: A | Discharge status: Home / Self Care | Payer: 59 | Attending: Emergency Medicine | Admitting: Emergency Medicine

## 2020-07-03 DIAGNOSIS — R109 Unspecified abdominal pain: Secondary | ICD-10-CM | POA: Diagnosis not present

## 2020-07-03 DIAGNOSIS — Z5321 Procedure and treatment not carried out due to patient leaving prior to being seen by health care provider: Secondary | ICD-10-CM | POA: Insufficient documentation

## 2020-07-03 LAB — CBC
HCT: 46 % (ref 39.0–52.0)
Hemoglobin: 14.8 g/dL (ref 13.0–17.0)
MCH: 32.2 pg (ref 26.0–34.0)
MCHC: 32.2 g/dL (ref 30.0–36.0)
MCV: 100.2 fL — ABNORMAL HIGH (ref 80.0–100.0)
Platelets: 251 10*3/uL (ref 150–400)
RBC: 4.59 MIL/uL (ref 4.22–5.81)
RDW: 13.4 % (ref 11.5–15.5)
WBC: 9.7 10*3/uL (ref 4.0–10.5)
nRBC: 0 % (ref 0.0–0.2)

## 2020-07-03 LAB — COMPREHENSIVE METABOLIC PANEL
ALT: 58 U/L — ABNORMAL HIGH (ref 0–44)
AST: 42 U/L — ABNORMAL HIGH (ref 15–41)
Albumin: 3.6 g/dL (ref 3.5–5.0)
Alkaline Phosphatase: 76 U/L (ref 38–126)
Anion gap: 10 (ref 5–15)
BUN: 8 mg/dL (ref 6–20)
CO2: 25 mmol/L (ref 22–32)
Calcium: 9.1 mg/dL (ref 8.9–10.3)
Chloride: 103 mmol/L (ref 98–111)
Creatinine, Ser: 0.69 mg/dL (ref 0.61–1.24)
GFR calc Af Amer: >60 mL/min (ref >60)
GFR calc non Af Amer: >60 mL/min (ref >60)
Glucose, Bld: 124 mg/dL — ABNORMAL HIGH (ref 70–99)
Potassium: 3.8 mmol/L (ref 3.5–5.1)
Sodium: 138 mmol/L (ref 135–145)
Total Bilirubin: 0.7 mg/dL (ref 0.3–1.2)
Total Protein: 7 g/dL (ref 6.5–8.1)

## 2020-07-03 LAB — URINALYSIS, ROUTINE W REFLEX MICROSCOPIC
Bacteria, UA: NONE SEEN
Bilirubin Urine: NEGATIVE
Glucose, UA: NEGATIVE mg/dL
Hgb urine dipstick: NEGATIVE
Ketones, ur: NEGATIVE mg/dL
Leukocytes,Ua: NEGATIVE
Nitrite: NEGATIVE
Protein, ur: 30 mg/dL — AB
Specific Gravity, Urine: 1.03 (ref 1.005–1.030)
pH: 6 (ref 5.0–8.0)

## 2020-07-03 LAB — LIPASE, BLOOD: Lipase: 23 U/L (ref 11–51)

## 2020-07-03 MED ORDER — SODIUM CHLORIDE 0.9% FLUSH: 3.0000 mL | Freq: Once | INTRAVENOUS | Status: DC

## 2020-07-03 NOTE — ED Triage Notes (Signed)
Pt c/o abd pain and bleeding from his belly bottom for the past 4 days.

## 2020-07-03 NOTE — ED Notes (Signed)
Patient stated he was leaving. 

## 2023-06-07 ENCOUNTER — Other Ambulatory Visit: Payer: Self-pay

## 2023-06-07 ENCOUNTER — Emergency Department
Admission: EM | Admit: 2023-06-07 | Discharge: 2023-06-07 | Disposition: A | Discharge status: Home / Self Care | Payer: 59 | Attending: Emergency Medicine | Admitting: Emergency Medicine

## 2023-06-07 ENCOUNTER — Encounter: Payer: Self-pay | Admitting: Intensive Care

## 2023-06-07 DIAGNOSIS — H9202 Otalgia, left ear: Secondary | ICD-10-CM | POA: Diagnosis present

## 2023-06-07 DIAGNOSIS — H60392 Other infective otitis externa, left ear: Secondary | ICD-10-CM | POA: Diagnosis not present

## 2023-06-07 MED ORDER — NEOMYCIN-POLYMYXIN-HC 3.5-10000-1 OT SUSP: 4.0000 [drp] | Freq: Four times a day (QID) | OTIC | 0 refills | Status: DC

## 2023-06-07 NOTE — ED Triage Notes (Signed)
C/o left ear pain since May. Reports has seen UC twice. Reports drainage at some point but none now. States his ear popped last night and pain has become worse and now having hearing loss

## 2023-06-07 NOTE — ED Provider Notes (Signed)
[facility] Provider Note    Event Date/Time   First MD Initiated Contact with Patient 06/07/23 [phone]     (approximate)   History   Otalgia   HPI  Luis Richards is a 32 y.o. male presents to the ED with complaint left ear pain that started in May.  Patient has been to urgent care where he was prescribed antibiotics which did not seem to be helping.  He then returned to the urgent care and was given a 3-day course of prednisone.  Patient states that he woke during the night with pain.  He denies any drainage from his ear.  No upper respiratory symptoms or allergy symptoms reported prior to his ear problems.  He denies any fever, chills, nasal congestion or trauma to his left ear.     Physical Exam   Triage Vital Signs: ED Triage Vitals [06/07/23 0744]  Enc Vitals Group     BP (!) 157/110     Pulse Rate (!) 116     Resp 16     Temp 98.4 F (36.9 C)     Temp Source Oral     SpO2 93 %     Weight (!) 370 lb (167.8 kg)     Height 5\' 8"  (1.727 m)     Head Circumference      Peak Flow      Pain Score 9     Pain Loc      Pain Edu?      Excl. in GC?     Most recent vital signs: Vitals:   06/07/23 0744 06/07/23 0754  BP: (!) 157/110 (!) 141/65  Pulse: (!) 116   Resp: 16 18  Temp: 98.4 F (36.9 C)   SpO2: 93% 97%     General: Awake, no distress.  CV:  Good peripheral perfusion.  Resp:  Normal effort.  Abd:  No distention.  Other:  Right EAC clear, TM is dull with minimal fluid present.  Left EAC with exudate and patient is moderately tender on exam with a speculum.  Tragus is tender.  No erythema is noted on examination of the TM.   ED Results / Procedures / Treatments   Labs (all labs ordered are listed, but only abnormal results are displayed) Labs Reviewed - No data to display     PROCEDURES:  Critical Care performed:   Procedures   MEDICATIONS ORDERED IN ED: Medications - No data to display   IMPRESSION / MDM /  ASSESSMENT AND PLAN / ED COURSE  I reviewed the triage vital signs and the nursing notes.   Differential diagnosis includes, but is not limited to, left otitis media, left otitis externa, eustachian tube dysfunction, cerumen impaction.  32 year old male presents to the ED with complaint of left ear pain for approximately 1 month.  Patient has been seen at urgent care in which antibiotics and prednisone does not seem to be helping.  On exam it appears that he has currently an otitis externa.  A prescription for Cortisporin otic suspension was sent to the pharmacy for him to begin using.  He is also encouraged to call make an appointment with [facility] as he does not seem to be improving with previous medications.     Patient's presentation is most consistent with acute illness / injury with system symptoms.  FINAL CLINICAL IMPRESSION(S) / ED DIAGNOSES   Final diagnoses:  Other infective acute otitis externa of left ear  Rx / DC Orders   ED Discharge Orders          Ordered    neomycin-polymyxin-hydrocortisone (CORTISPORIN) 3.5-10000-1 OTIC suspension  4 times daily        06/07/23 0839             Note:  This document was prepared using Dragon voice recognition software and may include unintentional dictation errors.   Tommi Rumps, PA-C 06/07/23 2956    Minna Antis, MD 06/07/23 1428

## 2023-06-07 NOTE — Discharge Instructions (Addendum)
Call Dr. Wyn Forster office and make an appointment for check of your ear and your hearing. Do not use any thing in your left ear.  Do not use Q-tips or fingers. Use ear drops for the next 7 days

## 2024-04-14 ENCOUNTER — Emergency Department
Admission: EM | Admit: 2024-04-14 | Discharge: 2024-04-14 | Disposition: A | Discharge status: Home / Self Care | Attending: Emergency Medicine | Admitting: Emergency Medicine

## 2024-04-14 ENCOUNTER — Emergency Department

## 2024-04-14 ENCOUNTER — Other Ambulatory Visit: Payer: Self-pay

## 2024-04-14 DIAGNOSIS — F1010 Alcohol abuse, uncomplicated: Secondary | ICD-10-CM | POA: Diagnosis not present

## 2024-04-14 DIAGNOSIS — Y908 Blood alcohol level of 240 mg/100 ml or more: Secondary | ICD-10-CM | POA: Insufficient documentation

## 2024-04-14 DIAGNOSIS — K760 Fatty (change of) liver, not elsewhere classified: Secondary | ICD-10-CM

## 2024-04-14 DIAGNOSIS — R1011 Right upper quadrant pain: Secondary | ICD-10-CM | POA: Insufficient documentation

## 2024-04-14 DIAGNOSIS — F10920 Alcohol use, unspecified with intoxication, uncomplicated: Secondary | ICD-10-CM

## 2024-04-14 DIAGNOSIS — R7989 Other specified abnormal findings of blood chemistry: Secondary | ICD-10-CM

## 2024-04-14 LAB — URINALYSIS, COMPLETE (UACMP) WITH MICROSCOPIC
Bacteria, UA: NONE SEEN
Bilirubin Urine: NEGATIVE
Glucose, UA: >=500 mg/dL — AB
Hgb urine dipstick: NEGATIVE
Ketones, ur: NEGATIVE mg/dL
Leukocytes,Ua: NEGATIVE
Nitrite: NEGATIVE
Protein, ur: NEGATIVE mg/dL
RBC / HPF: 0 RBC/hpf (ref 0–5)
Specific Gravity, Urine: 1.013 (ref 1.005–1.030)
Squamous Epithelial / HPF: 0 /HPF (ref 0–5)
pH: 7 (ref 5.0–8.0)

## 2024-04-14 LAB — TROPONIN I (HIGH SENSITIVITY)
Troponin I (High Sensitivity): 7 ng/L (ref <18)
Troponin I (High Sensitivity): 7 ng/L (ref <18)

## 2024-04-14 LAB — URINE DRUG SCREEN, QUALITATIVE (ARMC ONLY)
Amphetamines, Ur Screen: NOT DETECTED
Barbiturates, Ur Screen: NOT DETECTED
Benzodiazepine, Ur Scrn: NOT DETECTED
Cannabinoid 50 Ng, Ur ~~LOC~~: NOT DETECTED
Cocaine Metabolite,Ur ~~LOC~~: NOT DETECTED
MDMA (Ecstasy)Ur Screen: NOT DETECTED
Methadone Scn, Ur: NOT DETECTED
Opiate, Ur Screen: NOT DETECTED
Phencyclidine (PCP) Ur S: NOT DETECTED
Tricyclic, Ur Screen: NOT DETECTED

## 2024-04-14 LAB — COMPREHENSIVE METABOLIC PANEL WITH GFR
ALT: 178 U/L — ABNORMAL HIGH (ref 0–44)
AST: 170 U/L — ABNORMAL HIGH (ref 15–41)
Albumin: 4.2 g/dL (ref 3.5–5.0)
Alkaline Phosphatase: 67 U/L (ref 38–126)
Anion gap: 19 — ABNORMAL HIGH (ref 5–15)
BUN: 6 mg/dL (ref 6–20)
CO2: 24 mmol/L (ref 22–32)
Calcium: 9 mg/dL (ref 8.9–10.3)
Chloride: 100 mmol/L (ref 98–111)
Creatinine, Ser: 0.66 mg/dL (ref 0.61–1.24)
GFR, Estimated: >60 mL/min
Glucose, Bld: 274 mg/dL — ABNORMAL HIGH (ref 70–99)
Potassium: 3.2 mmol/L — ABNORMAL LOW (ref 3.5–5.1)
Sodium: 143 mmol/L (ref 135–145)
Total Bilirubin: 0.9 mg/dL (ref 0.0–1.2)
Total Protein: 7.8 g/dL (ref 6.5–8.1)

## 2024-04-14 LAB — LIPASE, BLOOD: Lipase: 41 U/L (ref 11–51)

## 2024-04-14 LAB — CBC
HCT: 48.3 % (ref 39.0–52.0)
Hemoglobin: 16.5 g/dL (ref 13.0–17.0)
MCH: 32.5 pg (ref 26.0–34.0)
MCHC: 34.2 g/dL (ref 30.0–36.0)
MCV: 95.3 fL (ref 80.0–100.0)
Platelets: 197 10*3/uL (ref 150–400)
RBC: 5.07 MIL/uL (ref 4.22–5.81)
RDW: 11.8 % (ref 11.5–15.5)
WBC: 4.6 10*3/uL (ref 4.0–10.5)
nRBC: 0 % (ref 0.0–0.2)

## 2024-04-14 LAB — BRAIN NATRIURETIC PEPTIDE: B Natriuretic Peptide: 12.6 pg/mL (ref 0.0–100.0)

## 2024-04-14 LAB — ETHANOL: Alcohol, Ethyl (B): 408 mg/dL

## 2024-04-14 MED ORDER — SODIUM CHLORIDE 0.9 % IV BOLUS
1000.0000 mL | Freq: Once | INTRAVENOUS | Status: AC
  Administered 2024-04-14: 1000 mL via INTRAVENOUS

## 2024-04-14 MED ORDER — ONDANSETRON HCL 4 MG/2ML IJ SOLN
4.0000 mg | Freq: Once | INTRAMUSCULAR | Status: AC
  Administered 2024-04-14: 4 mg via INTRAVENOUS
  Filled 2024-04-14: qty 2

## 2024-04-14 NOTE — ED Notes (Signed)
 Pt O2 on RA at 81% placed on 3L [place] with O2 improving to 96%

## 2024-04-14 NOTE — ED Provider Notes (Signed)
.-----------------------------------------   3:02 PM on 04/14/2024 -----------------------------------------  Blood pressure 132/89, pulse (!) 118, temperature (!) 97.5 F (36.4 C), temperature source Oral, resp. rate 20, height 5\' 8"  (1.727 m), weight 136.1 kg, SpO2 94%.  Assuming care from Dr. Azalee Bolds.  In short, Luis Richards is a 33 y.o. male with a chief complaint of UTI and Alcohol Intoxication .  Refer to the original H&P for additional details.  The current plan of care is to follow up labs, RUQ US , reassess.  On reassessment patient is clinically sober, he is tolerating p.o.. Discussed imaging and lab results including incidental findings with patient.  Referred him to primary care and instructed to follow-up for further management of his hepatic steatosis as well as repeat LFT testing.  Considered but no indication for inpatient admission at this time, he is safe for outpatient management.  Shared decision making with patient and he is agreeable plan for discharge.  Discharged with strict return precautions.  Clinical Course as of 04/14/24 1827  Sat Apr 14, 2024  1608 Independent review of labs, no leukocytosis, UA is not consistent with UTI, LFTs are mildly elevated, lipase is normal, creatinine is normal, electrolytes not severely deranged, his glucose is mildly elevated without evidence of acidosis, his BNP and Trope are not elevated.  His alcohol level is 408. [TT]  1730 Troponin I (High Sensitivity) Troponin x 2 is negative [TT]  1825 On reassessment patient is awake and alert, has no complaints, satting 96% on room air with a good waveform.  Discussed with him about imaging and lab results, once ultrasound and chest x-ray results come back, able to be discharged. [TT]  1825 DG Chest 2 View No active cardiopulmonary disease.  [TT]  1825 US  ABDOMEN LIMITED RUQ (LIVER/GB) IMPRESSION: 1. No acute abnormality. 2. Hepatic steatosis.   [TT]    Clinical Course User  Index [TT] Shane Darling, MD      Shane Darling, MD 04/14/24 [phone]

## 2024-04-14 NOTE — Discharge Instructions (Addendum)
 I have placed a referral for you for primary care, please follow-up with them to get repeat liver function testing and follow-up for the hepatic steatosis that was noted on your ultrasound.

## 2024-04-14 NOTE — ED Notes (Signed)
Pt given urinal to obtain sample

## 2024-04-14 NOTE — ED Provider Notes (Signed)
 [facility] Provider Note    Event Date/Time   First MD Initiated Contact with Patient 04/14/24 1217     (approximate)  History   Chief Complaint: UTI and Alcohol Intoxication  HPI  Luis Richards is a 33 y.o. male with a past medical history of alcohol abuse, presents to the emergency department for multiple issues.  According to EMS patient is brought from home as patient's dad thought the patient was not acting her breathing right this morning.  Patient states he has abstained from alcohol for approximately 7 days but due to severe withdrawal symptoms he began drinking again last night and drank approximately 1/5 of vodka.  Patient states he stopped drinking last night late, the father saw the patient this morning and thought he was not breathing correctly, was able to get the patient up however patient was not acting right so that called EMS to bring the patient.  Per patient he was recently diagnosed at an urgent care with urinary tract infection just picked up the prescription for Macrobid today.  Patient denies any urinary symptoms.  Patient states he has been experiencing intermittent right upper quadrant pain although denies any currently.  Denies any fever.  Patient states his last alcoholic drink was late last night denies any alcohol today.  States normally he will drink between 12-24 shots of liquor per day.  Physical Exam   Triage Vital Signs: ED Triage Vitals  Encounter Vitals Group     BP 04/14/24 1217 132/89     Systolic BP Percentile --      Diastolic BP Percentile --      Pulse Rate 04/14/24 1217 (!) 118     Resp 04/14/24 1217 20     Temp 04/14/24 1217 (!) 97.5 F (36.4 C)     Temp Source 04/14/24 1217 Oral     SpO2 04/14/24 1217 94 %     Weight 04/14/24 1218 296 lb 11.8 oz (134.6 kg)     Height 04/14/24 1218 5\' 10"  (1.778 m)     Head Circumference --      Peak Flow --      Pain Score 04/14/24 1218 0     Pain Loc --      Pain  Education --      Exclude from Growth Chart --     Most recent vital signs: Vitals:   04/14/24 1217  BP: 132/89  Pulse: (!) 118  Resp: 20  Temp: (!) 97.5 F (36.4 C)  SpO2: 94%    General: Awake, no distress.  CV:  Good peripheral perfusion.  Regular rate and rhythm  Resp:  Normal effort.  Equal breath sounds bilaterally.  Abd:  No distention.  Soft, nontender.  No rebound or guarding.  Benign abdomen.  ED Results / Procedures / Treatments   EKG  EKG viewed and interpreted by myself shows a sinus tachycardia at 114 bpm with a narrow QRS, normal axis, normal intervals, no concerning ST changes.  RADIOLOGY  Ultrasound pending   MEDICATIONS ORDERED IN ED: Medications  sodium chloride  0.9 % bolus 1,000 mL (1,000 mLs Intravenous New Bag/Given 04/14/24 1301)  ondansetron  (ZOFRAN ) injection 4 mg (4 mg Intravenous Given 04/14/24 1301)     IMPRESSION / MDM / ASSESSMENT AND PLAN / ED COURSE  I reviewed the triage vital signs and the nursing notes.  Patient's presentation is most consistent with acute presentation with potential threat to life or bodily function.  Patient presents to  the emergency department for alcohol use as well as intermittent right upper quadrant pain although none currently.  Dad was concerned given the significant alcohol intoxication late last night.  Patient is currently awake alert he has no complaints.  Patient admits that he drinks too much and he would be interested in detox and would like to be referred to [facility] upon discharge.  We will check labs including an ethanol level.  Given the patient's recently diagnosed urinary tract infection we will obtain a urinalysis to evaluate for any signs of infection.  Patient has Macrobid already prescribed for this but has not yet started this.  Given his intermittent right upper quadrant pain we will obtain an ultrasound.  Ultrasound and labs are pending.  Patient care signed out to oncoming provider.  FINAL  CLINICAL IMPRESSION(S) / ED DIAGNOSES   Right upper quadrant pain Alcohol use disorder   Note:  This document was prepared using Dragon voice recognition software and may include unintentional dictation errors.   Ruth Cove, MD 04/14/24 1439

## 2024-04-14 NOTE — ED Notes (Signed)
 Pt also stating feeling weak and tired

## 2024-04-14 NOTE — ED Triage Notes (Signed)
 PT BIB [facility] from Home for UTI and ETOH. EMS states pt has drank a 5th of vodka today, pt does have alcohol use disorder. Pt was also seen at [facility] yesterday and Dx with UTI. Was prescribed Abx however has not picked them up, per EMS neighbor took father to pharmacy to get prescriptions today. Pt denies any pain/SOB.

## 2024-04-16 ENCOUNTER — Emergency Department (HOSPITAL_COMMUNITY)
Admission: EM | Admit: 2024-04-16 | Discharge: 2024-04-16 | Disposition: A | Discharge status: Home / Self Care | Attending: Emergency Medicine | Admitting: Emergency Medicine

## 2024-04-16 ENCOUNTER — Emergency Department (HOSPITAL_COMMUNITY)

## 2024-04-16 ENCOUNTER — Encounter (HOSPITAL_COMMUNITY): Payer: Self-pay

## 2024-04-16 ENCOUNTER — Other Ambulatory Visit: Payer: Self-pay

## 2024-04-16 DIAGNOSIS — R7401 Elevation of levels of liver transaminase levels: Secondary | ICD-10-CM | POA: Diagnosis not present

## 2024-04-16 DIAGNOSIS — F1010 Alcohol abuse, uncomplicated: Secondary | ICD-10-CM | POA: Insufficient documentation

## 2024-04-16 DIAGNOSIS — R1013 Epigastric pain: Secondary | ICD-10-CM | POA: Insufficient documentation

## 2024-04-16 DIAGNOSIS — R748 Abnormal levels of other serum enzymes: Secondary | ICD-10-CM

## 2024-04-16 DIAGNOSIS — Z87891 Personal history of nicotine dependence: Secondary | ICD-10-CM | POA: Diagnosis not present

## 2024-04-16 LAB — CBC
HCT: 45.2 % (ref 39.0–52.0)
Hemoglobin: 15.2 g/dL (ref 13.0–17.0)
MCH: 32.8 pg (ref 26.0–34.0)
MCHC: 33.6 g/dL (ref 30.0–36.0)
MCV: 97.6 fL (ref 80.0–100.0)
Platelets: 163 10*3/uL (ref 150–400)
RBC: 4.63 MIL/uL (ref 4.22–5.81)
RDW: 11.9 % (ref 11.5–15.5)
WBC: 4.1 10*3/uL (ref 4.0–10.5)
nRBC: 0 % (ref 0.0–0.2)

## 2024-04-16 LAB — COMPREHENSIVE METABOLIC PANEL WITH GFR
ALT: 122 U/L — ABNORMAL HIGH (ref 0–44)
AST: 74 U/L — ABNORMAL HIGH (ref 15–41)
Albumin: 4 g/dL (ref 3.5–5.0)
Alkaline Phosphatase: 83 U/L (ref 38–126)
Anion gap: 14 (ref 5–15)
BUN: <5 mg/dL — ABNORMAL LOW (ref 6–20)
CO2: 25 mmol/L (ref 22–32)
Calcium: 9 mg/dL (ref 8.9–10.3)
Chloride: 101 mmol/L (ref 98–111)
Creatinine, Ser: 0.6 mg/dL — ABNORMAL LOW (ref 0.61–1.24)
GFR, Estimated: >60 mL/min (ref >60)
Glucose, Bld: 219 mg/dL — ABNORMAL HIGH (ref 70–99)
Potassium: 3.6 mmol/L (ref 3.5–5.1)
Sodium: 140 mmol/L (ref 135–145)
Total Bilirubin: 0.9 mg/dL (ref 0.0–1.2)
Total Protein: 7.6 g/dL (ref 6.5–8.1)

## 2024-04-16 LAB — RAPID URINE DRUG SCREEN, HOSP PERFORMED
Amphetamines: NOT DETECTED
Barbiturates: NOT DETECTED
Benzodiazepines: NOT DETECTED
Cocaine: NOT DETECTED
Opiates: NOT DETECTED
Tetrahydrocannabinol: NOT DETECTED

## 2024-04-16 LAB — LIPASE, BLOOD: Lipase: 34 U/L (ref 11–51)

## 2024-04-16 LAB — ETHANOL: Alcohol, Ethyl (B): <10 mg/dL (ref <10)

## 2024-04-16 MED ORDER — CHLORDIAZEPOXIDE HCL 25 MG PO CAPS
25.0000 mg | ORAL_CAPSULE | Freq: Once | ORAL | Status: AC
  Administered 2024-04-16: 25 mg via ORAL
  Filled 2024-04-16: qty 1

## 2024-04-16 MED ORDER — PANTOPRAZOLE SODIUM 40 MG IV SOLR
40.0000 mg | Freq: Once | INTRAVENOUS | Status: AC
  Administered 2024-04-16: 40 mg via INTRAVENOUS
  Filled 2024-04-16: qty 10

## 2024-04-16 MED ORDER — CHLORDIAZEPOXIDE HCL 25 MG PO CAPS
ORAL_CAPSULE | ORAL | 0 refills | Status: DC
Start: 2024-04-16 — End: 2024-07-24

## 2024-04-16 MED ORDER — SODIUM CHLORIDE 0.9 % IV BOLUS
1000.0000 mL | Freq: Once | INTRAVENOUS | Status: AC
  Administered 2024-04-16: 1000 mL via INTRAVENOUS

## 2024-04-16 MED ORDER — LORAZEPAM 1 MG PO TABS
1.0000 mg | ORAL_TABLET | Freq: Once | ORAL | Status: AC
  Administered 2024-04-16: 1 mg via ORAL
  Filled 2024-04-16: qty 1

## 2024-04-16 MED ORDER — ONDANSETRON HCL 4 MG PO TABS: 4.0000 mg | ORAL_TABLET | ORAL | 0 refills | Status: DC | PRN

## 2024-04-16 MED ORDER — ONDANSETRON HCL 4 MG/2ML IJ SOLN
4.0000 mg | Freq: Once | INTRAMUSCULAR | Status: AC
  Administered 2024-04-16: 4 mg via INTRAVENOUS
  Filled 2024-04-16: qty 2

## 2024-04-16 MED ORDER — LORAZEPAM 2 MG/ML IJ SOLN
1.0000 mg | Freq: Once | INTRAMUSCULAR | Status: AC
  Administered 2024-04-16: 1 mg via INTRAVENOUS
  Filled 2024-04-16: qty 1

## 2024-04-16 MED ORDER — IOHEXOL 350 MG/ML SOLN
100.0000 mL | Freq: Once | INTRAVENOUS | Status: AC | PRN
  Administered 2024-04-16: 100 mL via INTRAVENOUS

## 2024-04-16 MED ORDER — PANTOPRAZOLE SODIUM 40 MG PO TBEC
40.0000 mg | DELAYED_RELEASE_TABLET | Freq: Every day | ORAL | 0 refills | Status: DC
Start: 2024-04-16 — End: 2024-07-24

## 2024-04-16 MED ORDER — SUCRALFATE 1 G PO TABS
1.0000 g | ORAL_TABLET | Freq: Three times a day (TID) | ORAL | 0 refills | Status: DC
Start: 2024-04-16 — End: 2024-07-24

## 2024-04-16 NOTE — Discharge Instructions (Addendum)
RESOURCE GUIDE  Chronic Pain Problems: Contact [facility]  [phone] Patients need to be referred by their primary care doctor.  Insufficient Money for Medicine: Contact [employer]:  call [phone]  No Primary Care Doctor: Call [facility]  [phone] - can help you locate a primary care doctor that  accepts your insurance, provides certain services, etc. Physician Referral Service- [phone]  Agencies that provide inexpensive medical care: [facility]  [phone] [facility]  [phone] [facility]  [phone] [facility]  [phone] [facility]  [phone] [facility]  [phone]  Medicaid-accepting [facility] Providers: [facility]- [address]  [phone], Mon-Fri 9am-7pm, Sat 9am-1pm [facility]- [address] [place], [address] [place]  [phone] [facility]- [address], [address] [place]  [phone] [facility]- [address]  [phone] Renaye Rakers- [address] [place], [address], [phone]  Only accepts [place] patients after they have their name  applied to their card  Self Pay (no insurance) in [facility]: Sickle Cell Patients - [facility]  [address] [place], [phone] [facility]- [address] [place]  [phone]       [facility] [place]- [address]       -     [facility]- see information above (Speak to [employer] if you do not have insurance)       -  [facility]- [address],  [phone]       -  [facility]- [address] [phone]       -  Dr Julio Sicks-  [address], [place], [phone]       -  [facility] and [facility] - [address] [phone]       -  [facility]- [address] [phone], also [address]  [phone]       -     [facility]- [address] [place], [phone], 1st & 3rd Saturday         every month, 10am-1pm  -     [facility]   [address] [place], [place].   Phone:  [phone], Fax:  [phone]. Hours of Operation:  9 am - 6 pm, M-F.  -     [facility]   [address], [place]   Phone: [phone], Fax: [phone]. Hours of Operation:  8:30 am - 5:30 pm, M-F.    Dental Assistance If unable to pay or uninsured, contact:  [facility]. to become qualified for the adult dental clinic.  Patients with Medicaid: [facility] [phone], [phone] [address] [phone]  If unable to pay, or uninsured, contact [facility] [phone] in [place], [phone] in [facility]) to become qualified for the adult dental clinic  [facility] [address] [place], [place] [address] [phone] [email or web address]  Other Proofreader Services: [facility]- [address] [place], [place], [place], [address], [phone], [phone], 2nd and 4th Thursday of the month at 6:30am.  10 clients each day by appointment, can sometimes see walk-in patients if someone does not show for an appointment.  [facility]- [address] [place], [place], [address], [phone] [facility] [address] [place], [place], [address], [phone] [facility] Health Department- [phone] [facility] Health Department- [phone] [facility] [phone]      It was a pleasure caring for you today in the emergency department.  Please return to the emergency department for any worsening or worrisome symptoms.

## 2024-04-16 NOTE — ED Provider Notes (Signed)
 [facility] Provider Note  CSN: [id number] Arrival date & time: 04/16/24 0809  Chief Complaint(s) Delirium Tremens (DTS) and Abdominal Pain  HPI Luis Richards is a 33 y.o. male with past medical history as below, significant for chronic alcohol abuse, obesity, pancreatitis, fatty liver who presents to the ED with complaint of epigastric upper abdominal pain, alcohol withdrawal  He was seen in the ER at [facility] on 04/14/24 without intoxication, his workup at that time was reassuring, right upper quadrant ultrasound revealed hepatic steatosis.  He was recommended for outpatient alcohol rehab.  Patient returns today for ongoing pain, possible alcohol withdrawal.  Last etoh intake was around 7 PM yesterday.  He drinks approximately 12-24 shots in a day.  He had 12 shots of liquor yesterday.  Reports he is tremulous, anxious, sweating.  He is having ongoing right upper quadrant epigastric abdominal pain.  He feels the alcohol numbs the discomfort.  Feels the pain is worsened after p.o. intake.  No vomiting, no BRBPR or melena.  No hematemesis.  No rashes or jaundice.  He is interested alcohol rehab/detox  Past Medical History Past Medical History:  Diagnosis Date   Alcohol abuse    Fatty liver    Morbid obesity (HCC)    Vitamin D deficiency    Patient Active Problem List   Diagnosis Date Noted   Delirium tremens (HCC) 11/29/2017   Alcoholic hepatitis 11/29/2017   Pancreatitis, acute 11/29/2017   Alcohol abuse 11/28/2017   Alcohol dependence with withdrawal (HCC) 11/28/2017   Vitamin D deficiency 03/24/2016   Fatty liver 07/22/2015   Smoker 07/22/2015   Obesity (BMI 30-39.9) 07/22/2015   Home Medication(s) Prior to Admission medications   Medication Sig Start Date End Date Taking? Authorizing Provider  chlordiazePOXIDE  (LIBRIUM ) 25 MG capsule 50mg  PO TID x 1D, then 25-50mg  PO BID X 1D, then 25-50mg  PO QD X 1D 04/16/24  Yes Russella Courts A, DO   ondansetron  (ZOFRAN ) 4 MG tablet Take 1 tablet (4 mg total) by mouth every 4 (four) hours as needed for nausea or vomiting. 04/16/24  Yes Russella Courts A, DO  pantoprazole  (PROTONIX ) 40 MG tablet Take 1 tablet (40 mg total) by mouth daily for 14 days. 04/16/24 04/30/24 Yes Teddi Favors, DO  sucralfate  (CARAFATE ) 1 g tablet Take 1 tablet (1 g total) by mouth with breakfast, with lunch, and with evening meal for 7 days. 04/16/24 04/23/24 Yes Teddi Favors, DO  neomycin -polymyxin-hydrocortisone (CORTISPORIN) 3.5-10000-1 OTIC suspension Place 4 drops into the left ear 4 (four) times daily. Patient not taking: Reported on 04/14/2024 06/07/23   Stafford Eagles, PA-C  nitrofurantoin, macrocrystal-monohydrate, (MACROBID) 100 MG capsule Take 100 mg by mouth 2 (two) times daily. 04/13/24   [provider]  Past Surgical History Past Surgical History:  Procedure Laterality Date   APPENDECTOMY     Family History Family History  Problem Relation Age of Onset   Diabetes Father    Diabetes Paternal Aunt    Diabetes Paternal Uncle     Social History Social History   Tobacco Use   Smoking status: Former   Smokeless tobacco: Never  Advertising account planner   Vaping status: Never Used  Substance Use Topics   Alcohol use: Yes    Comment: 12+shots a day   Drug use: No   Allergies Patient has no known allergies.  Review of Systems A thorough review of systems was obtained and all systems are negative except as noted in the HPI and PMH.   Physical Exam Vital Signs  I have reviewed the triage vital signs BP (!) 144/85   Pulse 81   Temp 98.5 F (36.9 C)   Resp 17   Ht 5\' 8"  (1.727 m)   Wt 136.1 kg   SpO2 100%   BMI 45.61 kg/m  Physical Exam Vitals and nursing note reviewed.  Constitutional:      General: He is not in acute distress.    Appearance: He is  well-developed. He is diaphoretic.  HENT:     Head: Normocephalic and atraumatic.     Right Ear: External ear normal.     Left Ear: External ear normal.     Mouth/Throat:     Mouth: Mucous membranes are moist.  Eyes:     General: No scleral icterus. Cardiovascular:     Rate and Rhythm: Normal rate and regular rhythm.     Pulses: Normal pulses.     Heart sounds: Normal heart sounds.  Pulmonary:     Effort: Pulmonary effort is normal. No respiratory distress.     Breath sounds: Normal breath sounds.  Abdominal:     General: Abdomen is flat.     Palpations: Abdomen is soft.     Tenderness: There is abdominal tenderness in the epigastric area.  Musculoskeletal:     Cervical back: No rigidity.     Right lower leg: No edema.     Left lower leg: No edema.  Skin:    General: Skin is warm.     Capillary Refill: Capillary refill takes less than 2 seconds.  Neurological:     Mental Status: He is alert and oriented to person, place, and time.     GCS: GCS eye subscore is 4. GCS verbal subscore is 5. GCS motor subscore is 6.     Motor: Tremor present.  Psychiatric:        Mood and Affect: Mood normal.        Behavior: Behavior normal.     ED Results and Treatments Labs (all labs ordered are listed, but only abnormal results are displayed) Labs Reviewed  COMPREHENSIVE METABOLIC PANEL WITH GFR - Abnormal; Notable for the following components:      Result Value   Glucose, Bld 219 (*)    BUN <5 (*)    Creatinine, Ser 0.60 (*)    AST 74 (*)    ALT 122 (*)    All other components within normal limits  ETHANOL  CBC  LIPASE, BLOOD  RAPID URINE DRUG SCREEN, HOSP PERFORMED  Radiology CT ABDOMEN PELVIS W CONTRAST Result Date: 04/16/2024 CLINICAL DATA:  Epigastric pain EXAM: CT ABDOMEN AND PELVIS WITH CONTRAST TECHNIQUE: Multidetector CT imaging of the abdomen and  pelvis was performed using the standard protocol following bolus administration of intravenous contrast. RADIATION DOSE REDUCTION: This exam was performed according to the departmental dose-optimization program which includes automated exposure control, adjustment of the mA and/or kV according to patient size and/or use of iterative reconstruction technique. CONTRAST:  OMNIPAQUE  IOHEXOL  350 MG/ML SOLN COMPARISON:  Ultrasound abdomen 04/14/2024 FINDINGS: Lower chest: No acute abnormality. Hepatobiliary: The hepatic parenchyma is diffusely hypodense compared to the splenic parenchyma consistent with fatty infiltration. No focal liver abnormality. No gallstones, gallbladder wall thickening, or pericholecystic fluid. No biliary dilatation. Pancreas: No focal lesion. Normal pancreatic contour. No surrounding inflammatory changes. No main pancreatic ductal dilatation. Spleen: Normal in size without focal abnormality. Adrenals/Urinary Tract: No adrenal nodule bilaterally. Bilateral kidneys enhance symmetrically. No hydronephrosis. No hydroureter. The urinary bladder is unremarkable. Stomach/Bowel: Stomach is within normal limits. No evidence of bowel wall thickening or dilatation. Status post appendectomy. Vascular/Lymphatic: No abdominal aorta or iliac aneurysm. Mild atherosclerotic plaque of the aorta and its branches. No abdominal, pelvic, or inguinal lymphadenopathy. Reproductive: Prostate is unremarkable. Other: No intraperitoneal free fluid. No intraperitoneal free gas. No organized fluid collection. Musculoskeletal: No abdominal wall hernia or abnormality. No suspicious lytic or blastic osseous lesions. No acute displaced fracture. Multilevel degenerative changes of the spine. IMPRESSION: 1. Hepatic steatosis. 2. Otherwise no acute intra-abdominal or intrapelvic abnormality. Electronically Signed   By: Morgane  Naveau M.D.   On: 04/16/2024 11:21    Pertinent labs & imaging results that were available during  my care of the patient were reviewed by me and considered in my medical decision making (see MDM for details).  Medications Ordered in ED Medications  chlordiazePOXIDE  (LIBRIUM ) capsule 25 mg (has no administration in time range)  LORazepam  (ATIVAN ) injection 1 mg (1 mg Intravenous Given 04/16/24 0858)  pantoprazole  (PROTONIX ) injection 40 mg (40 mg Intravenous Given 04/16/24 0910)  sodium chloride  0.9 % bolus 1,000 mL (0 mLs Intravenous Stopped 04/16/24 0951)  ondansetron  (ZOFRAN ) injection 4 mg (4 mg Intravenous Given 04/16/24 0906)  LORazepam  (ATIVAN ) tablet 1 mg (1 mg Oral Given 04/16/24 0905)  iohexol  (OMNIPAQUE ) 350 MG/ML injection 100 mL (100 mLs Intravenous Contrast Given 04/16/24 1023)                                                                                                                                     Procedures Procedures  (including critical care time)  Medical Decision Making / ED Course    Medical Decision Making:    MUMIN DENOMME is a 33 y.o. male with past medical history as below, significant for chronic alcohol abuse, obesity, pancreatitis, fatty liver who presents to the ED with complaint of epigastric upper abdominal pain, alcohol withdrawal. The complaint involves an extensive  differential diagnosis and also carries with it a high risk of complications and morbidity.  Serious etiology was considered. Ddx includes but is not limited to: Differential diagnosis includes but is not exclusive to acute cholecystitis, intrathoracic causes for epigastric abdominal pain, gastritis, duodenitis, pancreatitis, small bowel or large bowel obstruction, abdominal aortic aneurysm, hernia, gastritis, etc.   Complete initial physical exam performed, notably the patient was in no acute distress, he is tremulous, diaphoretic, appears to be in at least mild alcohol withdrawal.    Reviewed and confirmed nursing documentation for past medical history, family history, social  history.  Vital signs reviewed.     Clinical Course as of 04/16/24 1135  Mon Apr 16, 2024  1127 Feeling better, tolerating PO [SG]    Clinical Course User Index [SG] Teddi Favors, DO    Brief summary: 33 year old male history as above including chronic alcohol abuse here with possible withdrawal, abdominal pain Seen recently at [facility], till workup, had negative right upper quadrant ultrasound.  Having ongoing pain.  His story is more concern for possible PUD.  Will get CT imaging given ongoing pain.  Will give antiemetic, Protonix , fluids, Ativan .  Labs/imaging reviewed, stable He is feeling better, tolerating po  Concern epig pain possible 2/2 PUD or gastritis, start ppi/carafate  and have him f/u with GI. Cut back on etoh  He is also interested in etoh cessation, given o/p resources, will trial librium .   Stable for dc, father will take him home  Patient in no distress and overall condition improved here in the ED. Detailed discussions were had with the patient/guardian regarding current findings, and need for close f/u with PCP or on call doctor. The patient/guardian has been instructed to return immediately if the symptoms worsen in any way for re-evaluation. Patient/guardian verbalized understanding and is in agreement with current care plan. All questions answered prior to discharge.                  Additional history obtained: -Additional history obtained from friend -External records from outside source obtained and reviewed including: Chart review including previous notes, labs, imaging, consultation notes including  ER evaluation, home medications, prior labs and imaging   Lab Tests: -I ordered, reviewed, and interpreted labs.   The pertinent results include:   Labs Reviewed  COMPREHENSIVE METABOLIC PANEL WITH GFR - Abnormal; Notable for the following components:      Result Value   Glucose, Bld 219 (*)    BUN <5 (*)    Creatinine, Ser 0.60 (*)     AST 74 (*)    ALT 122 (*)    All other components within normal limits  ETHANOL  CBC  LIPASE, BLOOD  RAPID URINE DRUG SCREEN, HOSP PERFORMED    Notable for LFT's elev  EKG   EKG Interpretation Date/Time:  Monday April 16 2024 08:36:46 EDT Ventricular Rate:  88 PR Interval:  162 QRS Duration:  98 QT Interval:  366 QTC Calculation: 442 R Axis:   88  Text Interpretation: Abnormal ECG When compared with ECG of 14-Apr-2024 12:16, PREVIOUS ECG IS PRESENT no stemi twi lead 3 is new Confirmed by Russella Courts ([id number]) on 04/16/2024 8:49:37 AM         Imaging Studies ordered: I ordered imaging studies including CTAP I independently visualized the following imaging with scope of interpretation limited to determining acute life threatening conditions related to emergency care; findings noted above I independently visualized and interpreted imaging. I agree with the radiologist  interpretation   Medicines ordered and prescription drug management: Meds ordered this encounter  Medications   LORazepam  (ATIVAN ) injection 1 mg   pantoprazole  (PROTONIX ) injection 40 mg   sodium chloride  0.9 % bolus 1,000 mL   ondansetron  (ZOFRAN ) injection 4 mg   LORazepam  (ATIVAN ) tablet 1 mg   iohexol  (OMNIPAQUE ) 350 MG/ML injection 100 mL   chlordiazePOXIDE  (LIBRIUM ) 25 MG capsule    Sig: 50mg  PO TID x 1D, then 25-50mg  PO BID X 1D, then 25-50mg  PO QD X 1D    Dispense:  10 capsule    Refill:  0   sucralfate  (CARAFATE ) 1 g tablet    Sig: Take 1 tablet (1 g total) by mouth with breakfast, with lunch, and with evening meal for 7 days.    Dispense:  21 tablet    Refill:  0   pantoprazole  (PROTONIX ) 40 MG tablet    Sig: Take 1 tablet (40 mg total) by mouth daily for 14 days.    Dispense:  14 tablet    Refill:  0   ondansetron  (ZOFRAN ) 4 MG tablet    Sig: Take 1 tablet (4 mg total) by mouth every 4 (four) hours as needed for nausea or vomiting.    Dispense:  5 tablet    Refill:  0   chlordiazePOXIDE   (LIBRIUM ) capsule 25 mg    -I have reviewed the patients home medicines and have made adjustments as needed   Consultations Obtained: na   Cardiac Monitoring: The patient was maintained on a cardiac monitor.  I personally viewed and interpreted the cardiac monitored which showed an underlying rhythm of: NSR Continuous pulse oximetry interpreted by myself, 100% on ra.    Social Determinants of Health:  Diagnosis or treatment significantly limited by social determinants of health: obesity and alcohol use   Reevaluation: After the interventions noted above, I reevaluated the patient and found that they have improved  Co morbidities that complicate the patient evaluation  Past Medical History:  Diagnosis Date   Alcohol abuse    Fatty liver    Morbid obesity (HCC)    Vitamin D deficiency       Dispostion: Disposition decision including need for hospitalization was considered, and patient discharged from emergency department.    Final Clinical Impression(s) / ED Diagnoses Final diagnoses:  Alcohol abuse  Epigastric pain  Elevated liver enzymes        Teddi Favors, DO 04/16/24 1135

## 2024-04-16 NOTE — ED Triage Notes (Signed)
 Patient is a heavy alcoholic drinnking 12+shots reports stopped last night around 7pm.  Patient is diaphoretic, trembling without arm extention.  Patient complains of right epigastric Upper quad pain as well as n/v.

## 2024-04-16 NOTE — ED Notes (Signed)
 Patient transported to CT

## 2024-04-16 NOTE — ED Notes (Signed)
 Patient provided urinal for urine sample collection

## 2024-04-16 NOTE — ED Notes (Signed)
 Patient endorses last drink yesterday at 1900. Patient states pain is constant but numbed by drinking. Endorses approximately 12 shots of liquor yesterday.

## 2024-07-24 ENCOUNTER — Other Ambulatory Visit: Payer: Self-pay

## 2024-07-24 ENCOUNTER — Inpatient Hospital Stay (HOSPITAL_COMMUNITY)

## 2024-07-24 ENCOUNTER — Emergency Department (HOSPITAL_COMMUNITY)

## 2024-07-24 ENCOUNTER — Inpatient Hospital Stay (HOSPITAL_COMMUNITY)
Admission: EM | Admit: 2024-07-24 | Discharge: 2024-07-28 | DRG: 897 | Disposition: A | Discharge status: Home / Self Care | Attending: Family Medicine | Admitting: Family Medicine

## 2024-07-24 ENCOUNTER — Encounter (HOSPITAL_COMMUNITY): Payer: Self-pay | Admitting: Internal Medicine

## 2024-07-24 DIAGNOSIS — K7010 Alcoholic hepatitis without ascites: Secondary | ICD-10-CM | POA: Diagnosis present

## 2024-07-24 DIAGNOSIS — Z23 Encounter for immunization: Secondary | ICD-10-CM | POA: Diagnosis not present

## 2024-07-24 DIAGNOSIS — R4020 Unspecified coma: Secondary | ICD-10-CM | POA: Diagnosis present

## 2024-07-24 DIAGNOSIS — E876 Hypokalemia: Secondary | ICD-10-CM | POA: Diagnosis present

## 2024-07-24 DIAGNOSIS — D6959 Other secondary thrombocytopenia: Secondary | ICD-10-CM | POA: Diagnosis present

## 2024-07-24 DIAGNOSIS — Z6841 Body Mass Index (BMI) 40.0 and over, adult: Secondary | ICD-10-CM

## 2024-07-24 DIAGNOSIS — E66813 Obesity, class 3: Secondary | ICD-10-CM | POA: Diagnosis present

## 2024-07-24 DIAGNOSIS — I6622 Occlusion and stenosis of left posterior cerebral artery: Secondary | ICD-10-CM | POA: Diagnosis present

## 2024-07-24 DIAGNOSIS — F419 Anxiety disorder, unspecified: Secondary | ICD-10-CM | POA: Diagnosis present

## 2024-07-24 DIAGNOSIS — F10232 Alcohol dependence with withdrawal with perceptual disturbance: Secondary | ICD-10-CM | POA: Diagnosis not present

## 2024-07-24 DIAGNOSIS — R7401 Elevation of levels of liver transaminase levels: Secondary | ICD-10-CM | POA: Diagnosis not present

## 2024-07-24 DIAGNOSIS — E041 Nontoxic single thyroid nodule: Secondary | ICD-10-CM | POA: Diagnosis present

## 2024-07-24 DIAGNOSIS — Z79899 Other long term (current) drug therapy: Secondary | ICD-10-CM | POA: Diagnosis not present

## 2024-07-24 DIAGNOSIS — R9431 Abnormal electrocardiogram [ECG] [EKG]: Secondary | ICD-10-CM | POA: Diagnosis present

## 2024-07-24 DIAGNOSIS — D696 Thrombocytopenia, unspecified: Secondary | ICD-10-CM | POA: Diagnosis not present

## 2024-07-24 DIAGNOSIS — Z833 Family history of diabetes mellitus: Secondary | ICD-10-CM

## 2024-07-24 DIAGNOSIS — K921 Melena: Secondary | ICD-10-CM | POA: Diagnosis present

## 2024-07-24 DIAGNOSIS — W010XXA Fall on same level from slipping, tripping and stumbling without subsequent striking against object, initial encounter: Secondary | ICD-10-CM | POA: Diagnosis present

## 2024-07-24 DIAGNOSIS — I669 Occlusion and stenosis of unspecified cerebral artery: Secondary | ICD-10-CM | POA: Diagnosis present

## 2024-07-24 DIAGNOSIS — W19XXXA Unspecified fall, initial encounter: Secondary | ICD-10-CM | POA: Diagnosis present

## 2024-07-24 DIAGNOSIS — S01511A Laceration without foreign body of lip, initial encounter: Secondary | ICD-10-CM | POA: Diagnosis present

## 2024-07-24 DIAGNOSIS — S0990XA Unspecified injury of head, initial encounter: Secondary | ICD-10-CM | POA: Diagnosis present

## 2024-07-24 DIAGNOSIS — F10239 Alcohol dependence with withdrawal, unspecified: Secondary | ICD-10-CM | POA: Diagnosis present

## 2024-07-24 DIAGNOSIS — R7989 Other specified abnormal findings of blood chemistry: Secondary | ICD-10-CM | POA: Diagnosis not present

## 2024-07-24 DIAGNOSIS — I10 Essential (primary) hypertension: Secondary | ICD-10-CM | POA: Diagnosis present

## 2024-07-24 DIAGNOSIS — Y903 Blood alcohol level of 60-79 mg/100 ml: Secondary | ICD-10-CM | POA: Diagnosis present

## 2024-07-24 DIAGNOSIS — F10939 Alcohol use, unspecified with withdrawal, unspecified: Secondary | ICD-10-CM | POA: Insufficient documentation

## 2024-07-24 DIAGNOSIS — F10932 Alcohol use, unspecified with withdrawal with perceptual disturbance: Principal | ICD-10-CM

## 2024-07-24 DIAGNOSIS — E1165 Type 2 diabetes mellitus with hyperglycemia: Secondary | ICD-10-CM | POA: Diagnosis present

## 2024-07-24 DIAGNOSIS — Z87891 Personal history of nicotine dependence: Secondary | ICD-10-CM

## 2024-07-24 DIAGNOSIS — K760 Fatty (change of) liver, not elsewhere classified: Secondary | ICD-10-CM | POA: Diagnosis present

## 2024-07-24 DIAGNOSIS — R748 Abnormal levels of other serum enzymes: Secondary | ICD-10-CM

## 2024-07-24 DIAGNOSIS — E559 Vitamin D deficiency, unspecified: Secondary | ICD-10-CM | POA: Diagnosis present

## 2024-07-24 LAB — COMPREHENSIVE METABOLIC PANEL WITH GFR
ALT: 117 U/L — ABNORMAL HIGH (ref 0–44)
AST: 147 U/L — ABNORMAL HIGH (ref 15–41)
Albumin: 4.2 g/dL (ref 3.5–5.0)
Alkaline Phosphatase: 82 U/L (ref 38–126)
Anion gap: 15 (ref 5–15)
BUN: 6 mg/dL (ref 6–20)
CO2: 26 mmol/L (ref 22–32)
Calcium: 8.9 mg/dL (ref 8.9–10.3)
Chloride: 93 mmol/L — ABNORMAL LOW (ref 98–111)
Creatinine, Ser: 0.54 mg/dL — ABNORMAL LOW (ref 0.61–1.24)
GFR, Estimated: >60 mL/min (ref >60)
Glucose, Bld: 358 mg/dL — ABNORMAL HIGH (ref 70–99)
Potassium: 3.3 mmol/L — ABNORMAL LOW (ref 3.5–5.1)
Sodium: 134 mmol/L — ABNORMAL LOW (ref 135–145)
Total Bilirubin: 1.6 mg/dL — ABNORMAL HIGH (ref 0.0–1.2)
Total Protein: 8 g/dL (ref 6.5–8.1)

## 2024-07-24 LAB — CBC WITH DIFFERENTIAL/PLATELET
Abs Immature Granulocytes: 0.01 K/uL (ref 0.00–0.07)
Basophils Absolute: 0.1 K/uL (ref 0.0–0.1)
Basophils Relative: 2 %
Eosinophils Absolute: 0 K/uL (ref 0.0–0.5)
Eosinophils Relative: 0 %
HCT: 48.4 % (ref 39.0–52.0)
Hemoglobin: 16.3 g/dL (ref 13.0–17.0)
Immature Granulocytes: 0 %
Lymphocytes Relative: 18 %
Lymphs Abs: 0.9 K/uL (ref 0.7–4.0)
MCH: 32.3 pg (ref 26.0–34.0)
MCHC: 33.7 g/dL (ref 30.0–36.0)
MCV: 96 fL (ref 80.0–100.0)
Monocytes Absolute: 0.5 K/uL (ref 0.1–1.0)
Monocytes Relative: 10 %
Neutro Abs: 3.2 K/uL (ref 1.7–7.7)
Neutrophils Relative %: 70 %
Platelets: 146 K/uL — ABNORMAL LOW (ref 150–400)
RBC: 5.04 MIL/uL (ref 4.22–5.81)
RDW: 11.9 % (ref 11.5–15.5)
WBC: 4.6 K/uL (ref 4.0–10.5)
nRBC: 0 % (ref 0.0–0.2)

## 2024-07-24 LAB — URINALYSIS, ROUTINE W REFLEX MICROSCOPIC
Bacteria, UA: NONE SEEN
Bilirubin Urine: NEGATIVE
Glucose, UA: >=500 mg/dL — AB
Hgb urine dipstick: NEGATIVE
Ketones, ur: 20 mg/dL — AB
Leukocytes,Ua: NEGATIVE
Nitrite: NEGATIVE
Protein, ur: 100 mg/dL — AB
Specific Gravity, Urine: 1.042 — ABNORMAL HIGH (ref 1.005–1.030)
pH: 7 (ref 5.0–8.0)

## 2024-07-24 LAB — GLUCOSE, CAPILLARY
Glucose-Capillary: 288 mg/dL — ABNORMAL HIGH (ref 70–99)
Glucose-Capillary: 272 mg/dL — ABNORMAL HIGH (ref 70–99)

## 2024-07-24 LAB — RAPID URINE DRUG SCREEN, HOSP PERFORMED
Amphetamines: NOT DETECTED
Barbiturates: NOT DETECTED
Benzodiazepines: NOT DETECTED
Cocaine: NOT DETECTED
Opiates: NOT DETECTED
Tetrahydrocannabinol: NOT DETECTED

## 2024-07-24 LAB — ETHANOL: Alcohol, Ethyl (B): 69 mg/dL — ABNORMAL HIGH (ref <15)

## 2024-07-24 LAB — MAGNESIUM: Magnesium: 1.3 mg/dL — ABNORMAL LOW (ref 1.7–2.4)

## 2024-07-24 LAB — HEMOGLOBIN A1C
Hgb A1c MFr Bld: 9.3 % — ABNORMAL HIGH (ref 4.8–5.6)
Mean Plasma Glucose: 220.21 mg/dL

## 2024-07-24 LAB — MRSA NEXT GEN BY PCR, NASAL: MRSA by PCR Next Gen: NOT DETECTED

## 2024-07-24 LAB — LIPASE, BLOOD: Lipase: 35 U/L (ref 11–51)

## 2024-07-24 LAB — CBG MONITORING, ED
Glucose-Capillary: 398 mg/dL — ABNORMAL HIGH (ref 70–99)
Glucose-Capillary: 194 mg/dL — ABNORMAL HIGH (ref 70–99)

## 2024-07-24 LAB — AMMONIA: Ammonia: 33 umol/L (ref 9–35)

## 2024-07-24 LAB — PHOSPHORUS: Phosphorus: 2.3 mg/dL — ABNORMAL LOW (ref 2.5–4.6)

## 2024-07-24 MED ORDER — FOLIC ACID 1 MG PO TABS
1.0000 mg | ORAL_TABLET | Freq: Every day | ORAL | Status: DC
  Administered 2024-07-24 – 2024-07-28 (×5): 1 mg via ORAL
  Filled 2024-07-24 (×5): qty 1

## 2024-07-24 MED ORDER — PHENOBARBITAL SODIUM 130 MG/ML IJ SOLN
130.0000 mg | Freq: Once | INTRAMUSCULAR | Status: AC
  Administered 2024-07-24: 130 mg via INTRAVENOUS
  Filled 2024-07-24: qty 1

## 2024-07-24 MED ORDER — LORAZEPAM 1 MG PO TABS
2.0000 mg | ORAL_TABLET | Freq: Once | ORAL | Status: AC
  Administered 2024-07-24: 2 mg via ORAL
  Filled 2024-07-24: qty 2

## 2024-07-24 MED ORDER — ONDANSETRON HCL 4 MG/2ML IJ SOLN: 4.0000 mg | Freq: Four times a day (QID) | INTRAMUSCULAR | Status: DC | PRN

## 2024-07-24 MED ORDER — LORAZEPAM 1 MG PO TABS
0.0000 mg | ORAL_TABLET | Freq: Three times a day (TID) | ORAL | Status: AC
  Administered 2024-07-26: 1 mg via ORAL
  Administered 2024-07-26: 2 mg via ORAL
  Administered 2024-07-27 – 2024-07-28 (×2): 1 mg via ORAL
  Filled 2024-07-24: qty 1
  Filled 2024-07-24: qty 2
  Filled 2024-07-24 (×2): qty 1

## 2024-07-24 MED ORDER — ACETAMINOPHEN 650 MG RE SUPP: 650.0000 mg | Freq: Four times a day (QID) | RECTAL | Status: DC | PRN

## 2024-07-24 MED ORDER — PROCHLORPERAZINE EDISYLATE 10 MG/2ML IJ SOLN
10.0000 mg | Freq: Four times a day (QID) | INTRAMUSCULAR | Status: DC | PRN
  Administered 2024-07-25: 10 mg via INTRAVENOUS
  Filled 2024-07-24: qty 2

## 2024-07-24 MED ORDER — POTASSIUM CHLORIDE IN NACL 20-0.9 MEQ/L-% IV SOLN
INTRAVENOUS | Status: AC
  Filled 2024-07-24: qty 1000

## 2024-07-24 MED ORDER — IOHEXOL 350 MG/ML SOLN
100.0000 mL | Freq: Once | INTRAVENOUS | Status: AC | PRN
  Administered 2024-07-24: 100 mL via INTRAVENOUS

## 2024-07-24 MED ORDER — ADULT MULTIVITAMIN W/MINERALS CH
1.0000 | ORAL_TABLET | Freq: Every day | ORAL | Status: DC
  Administered 2024-07-25 – 2024-07-28 (×4): 1 via ORAL
  Filled 2024-07-24 (×4): qty 1

## 2024-07-24 MED ORDER — PANTOPRAZOLE SODIUM 40 MG IV SOLR
40.0000 mg | Freq: Once | INTRAVENOUS | Status: AC
  Administered 2024-07-24: 40 mg via INTRAVENOUS
  Filled 2024-07-24: qty 10

## 2024-07-24 MED ORDER — ORAL CARE MOUTH RINSE: 15.0000 mL | OROMUCOSAL | Status: DC | PRN

## 2024-07-24 MED ORDER — PHENOBARBITAL 32.4 MG PO TABS
16.2000 mg | ORAL_TABLET | Freq: Two times a day (BID) | ORAL | Status: DC
  Administered 2024-07-28: 16.2 mg via ORAL
  Filled 2024-07-24: qty 1

## 2024-07-24 MED ORDER — TETANUS-DIPHTH-ACELL PERTUSSIS 5-2.5-18.5 LF-MCG/0.5 IM SUSY
0.5000 mL | PREFILLED_SYRINGE | Freq: Once | INTRAMUSCULAR | Status: AC
  Administered 2024-07-24: 0.5 mL via INTRAMUSCULAR
  Filled 2024-07-24: qty 0.5

## 2024-07-24 MED ORDER — CHLORHEXIDINE GLUCONATE CLOTH 2 % EX PADS
6.0000 | MEDICATED_PAD | Freq: Every day | CUTANEOUS | Status: DC
  Administered 2024-07-24 – 2024-07-28 (×5): 6 via TOPICAL

## 2024-07-24 MED ORDER — INSULIN ASPART 100 UNIT/ML IJ SOLN
0.0000 [IU] | Freq: Three times a day (TID) | INTRAMUSCULAR | Status: DC
  Administered 2024-07-24: 4 [IU] via SUBCUTANEOUS
  Filled 2024-07-24: qty 0.2

## 2024-07-24 MED ORDER — THIAMINE MONONITRATE 100 MG PO TABS
100.0000 mg | ORAL_TABLET | Freq: Every day | ORAL | Status: DC
  Administered 2024-07-26 – 2024-07-28 (×3): 100 mg via ORAL
  Filled 2024-07-24 (×3): qty 1

## 2024-07-24 MED ORDER — THIAMINE HCL 100 MG/ML IJ SOLN
100.0000 mg | Freq: Once | INTRAMUSCULAR | Status: AC
  Administered 2024-07-24: 100 mg via INTRAVENOUS
  Filled 2024-07-24: qty 2

## 2024-07-24 MED ORDER — THIAMINE HCL 100 MG/ML IJ SOLN
100.0000 mg | Freq: Every day | INTRAMUSCULAR | Status: DC
  Administered 2024-07-25: 100 mg via INTRAVENOUS
  Filled 2024-07-24 (×2): qty 2

## 2024-07-24 MED ORDER — LORAZEPAM 1 MG PO TABS
2.0000 mg | ORAL_TABLET | ORAL | Status: AC | PRN
Start: 2024-07-24 — End: 2024-07-27
  Administered 2024-07-25: 2 mg via ORAL
  Filled 2024-07-24: qty 2

## 2024-07-24 MED ORDER — INSULIN ASPART 100 UNIT/ML IJ SOLN
0.0000 [IU] | Freq: Every day | INTRAMUSCULAR | Status: DC
  Administered 2024-07-24 – 2024-07-25 (×2): 3 [IU] via SUBCUTANEOUS
  Administered 2024-07-26: 2 [IU] via SUBCUTANEOUS

## 2024-07-24 MED ORDER — PHENOBARBITAL 32.4 MG PO TABS
64.8000 mg | ORAL_TABLET | Freq: Two times a day (BID) | ORAL | Status: AC
  Administered 2024-07-26 (×2): 64.8 mg via ORAL
  Filled 2024-07-24 (×2): qty 2

## 2024-07-24 MED ORDER — LABETALOL HCL 5 MG/ML IV SOLN: 20.0000 mg | INTRAVENOUS | Status: DC | PRN

## 2024-07-24 MED ORDER — ONDANSETRON HCL 4 MG PO TABS: 4.0000 mg | ORAL_TABLET | Freq: Four times a day (QID) | ORAL | Status: DC | PRN

## 2024-07-24 MED ORDER — PHENOBARBITAL 32.4 MG PO TABS
32.4000 mg | ORAL_TABLET | Freq: Two times a day (BID) | ORAL | Status: AC
  Administered 2024-07-27 (×2): 32.4 mg via ORAL
  Filled 2024-07-24 (×2): qty 1

## 2024-07-24 MED ORDER — K PHOS MONO-SOD PHOS DI & MONO 155-852-130 MG PO TABS
500.0000 mg | ORAL_TABLET | Freq: Three times a day (TID) | ORAL | Status: AC
  Administered 2024-07-24 – 2024-07-25 (×3): 500 mg via ORAL
  Filled 2024-07-24 (×3): qty 2

## 2024-07-24 MED ORDER — LORAZEPAM 1 MG PO TABS
0.0000 mg | ORAL_TABLET | ORAL | Status: AC
  Administered 2024-07-24 – 2024-07-25 (×4): 1 mg via ORAL
  Administered 2024-07-25: 4 mg via ORAL
  Administered 2024-07-25: 3 mg via ORAL
  Administered 2024-07-25: 2 mg via ORAL
  Administered 2024-07-26: 1 mg via ORAL
  Administered 2024-07-26: 4 mg via ORAL
  Administered 2024-07-26: 2 mg via ORAL
  Filled 2024-07-24: qty 1
  Filled 2024-07-24 (×2): qty 4
  Filled 2024-07-24: qty 2
  Filled 2024-07-24 (×2): qty 1
  Filled 2024-07-24: qty 2
  Filled 2024-07-24 (×2): qty 1

## 2024-07-24 MED ORDER — LABETALOL HCL 5 MG/ML IV SOLN
20.0000 mg | INTRAVENOUS | Status: DC | PRN
  Administered 2024-07-24 – 2024-07-26 (×3): 20 mg via INTRAVENOUS
  Filled 2024-07-24 (×4): qty 4

## 2024-07-24 MED ORDER — LORAZEPAM 1 MG PO TABS: 1.0000 mg | ORAL_TABLET | ORAL | Status: DC | PRN

## 2024-07-24 MED ORDER — LORAZEPAM 1 MG PO TABS
1.0000 mg | ORAL_TABLET | ORAL | Status: AC | PRN
  Filled 2024-07-24: qty 3

## 2024-07-24 MED ORDER — POTASSIUM CHLORIDE CRYS ER 20 MEQ PO TBCR
40.0000 meq | EXTENDED_RELEASE_TABLET | Freq: Once | ORAL | Status: AC
  Administered 2024-07-24: 40 meq via ORAL
  Filled 2024-07-24: qty 2

## 2024-07-24 MED ORDER — LORAZEPAM 2 MG/ML IJ SOLN: 1.0000 mg | INTRAMUSCULAR | Status: DC | PRN

## 2024-07-24 MED ORDER — LABETALOL HCL 5 MG/ML IV SOLN: 20.0000 mg | Freq: Once | INTRAVENOUS | Status: DC

## 2024-07-24 MED ORDER — SODIUM CHLORIDE 0.9 % IV BOLUS
1000.0000 mL | Freq: Once | INTRAVENOUS | Status: AC
  Administered 2024-07-24: 1000 mL via INTRAVENOUS

## 2024-07-24 MED ORDER — ACETAMINOPHEN 325 MG PO TABS
650.0000 mg | ORAL_TABLET | Freq: Four times a day (QID) | ORAL | Status: DC | PRN
Start: 2024-07-24 — End: 2024-07-28

## 2024-07-24 MED ORDER — PHENOBARBITAL 32.4 MG PO TABS
97.2000 mg | ORAL_TABLET | Freq: Two times a day (BID) | ORAL | Status: AC
  Administered 2024-07-25 (×2): 97.2 mg via ORAL
  Filled 2024-07-24 (×2): qty 3

## 2024-07-24 MED ORDER — INSULIN ASPART 100 UNIT/ML IJ SOLN
0.0000 [IU] | Freq: Three times a day (TID) | INTRAMUSCULAR | Status: DC
  Administered 2024-07-25: 4 [IU] via SUBCUTANEOUS
  Administered 2024-07-25: 15 [IU] via SUBCUTANEOUS
  Administered 2024-07-25 – 2024-07-26 (×2): 7 [IU] via SUBCUTANEOUS
  Administered 2024-07-26 (×2): 4 [IU] via SUBCUTANEOUS
  Administered 2024-07-27: 7 [IU] via SUBCUTANEOUS
  Administered 2024-07-27: 3 [IU] via SUBCUTANEOUS
  Administered 2024-07-27 – 2024-07-28 (×2): 4 [IU] via SUBCUTANEOUS

## 2024-07-24 MED ORDER — MAGNESIUM SULFATE 2 GM/50ML IV SOLN
2.0000 g | Freq: Once | INTRAVENOUS | Status: AC
  Administered 2024-07-24: 2 g via INTRAVENOUS
  Filled 2024-07-24: qty 50

## 2024-07-24 NOTE — Progress Notes (Signed)
 [facility] admitting [facility] admitting team:  Case reviewed with Dr. Elnor and will be admitted to the hospitalist service.  We have asked neurology to review his CT imaging first in case he needs to go to the stroke center.  The neurohospitalist team is currently very busy tending to code strokes.  They will review and will let us  know once they are done with those emergencies.  Alm Castor, MD.

## 2024-07-24 NOTE — H&P (Signed)
 History and Physical    Patient: Luis Richards FMW:[id number] DOB: 12/13/1991 DOA: 07/24/2024 DOS: the patient was seen and examined on 07/24/2024 PCP: Patient, No Pcp Per  Patient coming from: Home  Chief Complaint:  Chief Complaint  Patient presents with   Head Injury   Withdrawal   HPI: Luis Richards is a 33 y.o. male with medical history significant of class III obesity, vitamin D deficiency, hepatic steatosis, alcohol dependence (has been drinking about half a gallon a day liquor), history of alcoholic pancreatitis, tobacco use, history of alcohol withdrawal who presented to the emergency department complaints of headache, dizziness and tinnitus after hitting his head 3 days ago when he passed out.  The patient also sustained a laceration over his lower lip which is not bleeding.  He also saw blood in his stools earlier today.  He stated that he has not had alcohol in the last 2 days. He denied fever, chills, rhinorrhea, sore throat, wheezing or hemoptysis.  No chest pain, palpitations, diaphoresis, PND, orthopnea or recent pitting edema of the lower extremities.  No abdominal pain, nausea, emesis, diarrhea, constipation, melena or hematochezia.  No flank pain, dysuria, frequency or hematuria.  No polyuria, polydipsia, polyphagia or blurred vision.   Lab work: Urinalysis showed specific gravity of 1.042, greater than 500 glucose, ketones of 20 and protein of 100 mg/dL.  Urine microscopic examination was unremarkable UDS was negative.  CBC showed a white count of 4.6, hemoglobin 16.3 g/dL platelets 853.  Normal lipase and ammonia level.  Alcohol was 69, magnesium  1.3 and phosphorus 2.3 mg/dL.  CMP showed potassium 3.3 mmol/L, the rest of the electrolytes normalize after sodium/chloride corrected hyperglycemia.  Bilirubin was 1.6, BUN was 6 and creatinine 0.54 mg/dL.  Total protein 8.0 and albumin 4.2 g/dL.  AST 847, ALT 817 and alkaline phosphatase 82 units/L.  Imaging: Portable 1 view  chest radiograph with no active disease.  CTA head with no proximal intracranial large vessel occlusion identified.  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  CTA neck artifact obscures the very proximal right common carotid artery which seems to be patent within this limitation.  Nondominant left vertebral artery is developmentally diminutive, but patent throughout the neck.  Right vertebral artery is patent within the neck without stenosis.  There is a 12 mm left thyroid lobe nodule nonemergent thyroid ultrasound recommended.   ED course: Initial vital signs were temperature 98.6, pulse 110, respiration 15, blood pressure 162/103 mmHg and O2 sat 96% on room air.  The patient received normal saline 1000 mL liter bolus, Boostrix 0.5 mL IM x 1, thiamine  100 mg IVP, pantoprazole  40 mg IVP, phenobarbital  130 mg IVP x 2 and lorazepam  2 mg p.o. x 1.  I added KCl 40 mEq p.o. x 1, magnesium  sulfate 2 g IVPB and NS plus KCl 2500 mL/h's x 2 hours.  Review of Systems: As mentioned in the history of present illness. All other systems reviewed and are negative. Past Medical History:  Diagnosis Date   Alcohol abuse    Fatty liver    Morbid obesity (HCC)    Vitamin D deficiency    Past Surgical History:  Procedure Laterality Date   APPENDECTOMY     Social History:  reports that he has quit smoking. He has never used smokeless tobacco. He reports current alcohol use. He reports that he does not use drugs.  No Known Allergies  Family History  Problem Relation  Age of Onset   Diabetes Father    Diabetes Paternal Aunt    Diabetes Paternal Uncle     Prior to Admission medications   Medication Sig Start Date End Date Taking? Authorizing Provider  Aspirin -Caffeine 845-65 MG PACK Take 1 Package by mouth every 6 (six) hours as needed (Pain).   Yes [provider]    Physical Exam: Vitals:   07/24/24 1030 07/24/24 1100 07/24/24 1122 07/24/24  1128  BP: (!) 157/100 (!) 163/108 (!) 174/104   Pulse: (!) 103 (!) 105 (!) 106   Resp: 14 14    Temp:    98.5 F (36.9 C)  TempSrc:    Oral  SpO2: 94% 93%    Weight:      Height:       Physical Exam Vitals reviewed.  Constitutional:      General: He is awake. He is not in acute distress.    Appearance: He is morbidly obese. He is ill-appearing.  HENT:     Head: Normocephalic.     Nose: No rhinorrhea.     Mouth/Throat:     Mouth: Mucous membranes are moist.  Eyes:     General: No scleral icterus.    Pupils: Pupils are equal, round, and reactive to light.  Neck:     Vascular: No JVD.  Cardiovascular:     Rate and Rhythm: Regular rhythm. Tachycardia present.     Heart sounds: S1 normal and S2 normal.  Pulmonary:     Effort: Pulmonary effort is normal.     Breath sounds: Normal breath sounds. No wheezing, rhonchi or rales.  Abdominal:     General: Bowel sounds are normal. There is no distension.     Palpations: Abdomen is soft.     Tenderness: There is no abdominal tenderness. There is no right CVA tenderness or left CVA tenderness.  Musculoskeletal:     Cervical back: Neck supple.     Right lower leg: No edema.     Left lower leg: No edema.  Skin:    General: Skin is warm and dry.  Neurological:     General: No focal deficit present.     Mental Status: He is alert and oriented to person, place, and time.  Psychiatric:        Mood and Affect: Mood normal.        Behavior: Behavior normal. Behavior is cooperative.     Data Reviewed:  Results are pending, will review when available. EKG: Vent. rate 108 BPM PR interval 153 ms QRS duration 107 ms QT/QTcB 368/494 ms P-R-T axes 55 85 59 Sinus tachycardia Prolonged QT interval Baseline wander in lead(s) V5  Assessment and Plan: Principal Problem:   Alcoholic hepatitis Associated with:   Hyperbilirubinemia And,   Transaminitis In the setting of:   Alcohol dependence with withdrawal  (HCC) Stepdown/inpatient. IV fluids. Replace electrolytes. CIWA protocol with lorazepam . Will add phenobarbital  taper. Magnesium  sulfate supplementation. Folate, MVI and thiamine . Consult TOC team. Alcohol cessation advised.  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 No focal symptoms from the patient. Neurology suggested brain MRI.    Prolonged QT interval Avoid QT prolonging meds as possible. KCl supplementation Magnesium  sulfate 2 g IVPB given. Keep electrolytes optimized. Check EKG in the morning.    Hypokalemia Replacing. Follow in the morning.    Hypomagnesemia Replacing. Follow level in AM.  Hypophosphatemia Replacing. Follow-up level as needed.    Pseudohyponatremia Secondary to hyperglycemia.    Class 3 obesity Current BMI 45.61 kg/m. Would benefit from lifestyle modifications. Follow-up closely with PCP and/or bariatric clinic.    Type 2 diabetes mellitus with hyperglycemia (HCC) Carbohydrate modified diet. CBG monitoring with [facility] while in the hospital. Check hemoglobin A1c.    Thrombocytopenia (HCC) In the setting of alcohol consumption. Monitor platelet count.     Advance Care Planning:   Code Status: Full Code   Consults:   Family Communication:   Severity of Illness: The appropriate patient status for this patient is INPATIENT. Inpatient status is judged to be reasonable and necessary in order to provide the required intensity of service to ensure the patient's safety. The patient's presenting symptoms, physical exam findings, and initial radiographic and laboratory data in the context of their chronic comorbidities is felt to place them at high risk for further clinical deterioration. Furthermore, it is not anticipated that the patient will be medically stable for discharge from the hospital within 2 midnights of admission.    * I certify that at the point of admission it is my clinical judgment that the patient will require inpatient hospital care spanning beyond 2 midnights from the point of admission due to high intensity of service, high risk for further deterioration and high frequency of surveillance required.*  Author: Alm Dorn Castor, MD 07/24/2024 1:15 PM  For on call review [email or web address].   This document was prepared using Dragon voice recognition software and may contain some unintended transcription errors.

## 2024-07-24 NOTE — Progress Notes (Signed)
 ICM consulted for substance abuse resources. Resources attached to AVS. No additional ICM needs at this time.

## 2024-07-24 NOTE — ED Provider Notes (Addendum)
 [place] [facility]GENC[facility] DEPARTMENT AT [facility] Provider Note  CSN: [id number] Arrival date & time: 07/24/24 9268  Chief Complaint(s) Head Injury and Withdrawal  HPI Luis Richards is a 33 y.o. male with past medical history as below, significant for chronic alcohol abuse, obesity, hepatic steatosis, pancreatitis who presents to the ED with complaint of head injury, alcohol withdrawal  Patient drinks alcohol daily, up to 1/2 gallon a day.  Reports he became excessively drunk 2 days ago and tripped and fell, hit his head, thinks he had LOC.  He woke up the next day on the floor.  He has not had any alcohol in the past 2 days.  He has been having headaches, blurry vision, ringing in his ears, palpitations, worsening anxiety.  He is having auditory hallucinations.  No vomiting.  Intermittent confusion.  Feels like he is withdrawing from alcohol.  Also having epigastric abdominal pain, says he has an ulcer that he feels like is worsening.  Denies BRBPR or melena.  Abdominal pain worsened after drinking.  He fell he had a small laceration on his lip that has since started healing.  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 Authorizing Provider  Aspirin -Caffeine 845-65 MG PACK Take 1 Package by mouth every 6 (six) hours as needed (Pain).   Yes [provider]                                                                                                                                    Past Surgical History Past Surgical History:  Procedure Laterality Date   APPENDECTOMY     Family History Family History  Problem Relation Age of Onset   Diabetes Father    Diabetes Paternal Aunt    Diabetes Paternal Uncle     Social History Social History   Tobacco Use   Smoking status: Former   Smokeless tobacco: Never  Vaping Use   Vaping status: Never  Used  Substance Use Topics   Alcohol use: Yes    Comment: 12+shots a day   Drug use: No   Allergies Patient has no known allergies.  Review of Systems A thorough review of systems was obtained and all systems are negative except as noted in the HPI and PMH.   Physical Exam Vital Signs  I have reviewed the triage vital signs BP (!) 146/92 (BP Location: Right Arm)   Pulse (!) 103   Temp 98.5 F (36.9 C) (Oral)   Resp 19   Ht 5' 8 (1.727 m)   Wt 136.1 kg   SpO2 95%   BMI 45.61 kg/m  Physical Exam Vitals and nursing note reviewed.  Constitutional:      General: He is not in acute distress.    Appearance: He is well-developed. He is obese. He is diaphoretic.  HENT:     Head: Normocephalic and atraumatic.     Right Ear: External ear normal.     Left Ear: External ear normal.     Mouth/Throat:     Mouth: Mucous membranes are moist.  Eyes:     General: Vision grossly intact. Gaze aligned appropriately. No visual field deficit or scleral icterus.    Extraocular Movements: Extraocular movements intact.     Right eye: Nystagmus present.      Left eye: Nystagmus present.     Pupils: Pupils are equal, round, and reactive to light.  Cardiovascular:     Rate and Rhythm: Regular rhythm. Tachycardia present.     Pulses: Normal pulses.     Heart sounds: Normal heart sounds.  Pulmonary:     Effort: Pulmonary effort is normal. Tachypnea present. No respiratory distress.     Breath sounds: Normal breath sounds.  Abdominal:     General: Abdomen is flat.     Palpations: Abdomen is soft.     Tenderness: There is abdominal tenderness in the epigastric area.  Musculoskeletal:     Cervical back: Full passive range of motion without pain. No rigidity.     Right lower leg: No edema.     Left lower leg: No edema.  Skin:    General: Skin is warm.     Capillary Refill: Capillary refill takes less than 2 seconds.  Neurological:     Mental Status: He is alert and oriented to person, place, and time.     GCS: GCS eye subscore is 4. GCS verbal subscore is 5. GCS motor subscore is 6.     Cranial Nerves: Cranial nerves 2-12 are intact. No dysarthria or facial asymmetry.     Sensory: Sensation is intact. No sensory deficit.     Motor: Tremor present.     Coordination: Coordination is intact.     Comments: He responds to questions appropriately but his responses delayed He has tongue fasciculations, slight asterixis Gait testing deferred secondary to patient safety. Strength 5/5 to BLUE/BLLE, equal and symmetric    Psychiatric:        Mood and Affect: Mood normal.        Behavior: Behavior normal.     ED Results and Treatments Labs (all labs ordered are listed, but only abnormal results are displayed) Labs Reviewed  CBC WITH DIFFERENTIAL/PLATELET - Abnormal; Notable for the following components:      Result Value   Platelets 146 (*)    All other components within normal limits  COMPREHENSIVE METABOLIC PANEL WITH GFR - Abnormal; Notable for the following components:   Sodium  134 (*)    Potassium 3.3 (*)    Chloride 93 (*)     Glucose, Bld 358 (*)    Creatinine, Ser 0.54 (*)    AST 147 (*)    ALT 117 (*)    Total Bilirubin 1.6 (*)    All other components within normal limits  ETHANOL - Abnormal; Notable for the following components:   Alcohol, Ethyl (B) 69 (*)    All other components within normal limits  URINALYSIS, ROUTINE W REFLEX MICROSCOPIC - Abnormal; Notable for the following components:   Specific Gravity, Urine 1.042 (*)    Glucose, UA >=500 (*)    Ketones, ur 20 (*)    Protein, ur 100 (*)    All other components within normal limits  MAGNESIUM  - Abnormal; Notable for the following components:   Magnesium  1.3 (*)    All other components within normal limits  PHOSPHORUS - Abnormal; Notable for the following components:   Phosphorus 2.3 (*)    All other components within normal limits  HEMOGLOBIN A1C - Abnormal; Notable for the following components:   Hgb A1c MFr Bld 9.3 (*)    All other components within normal limits  CBG MONITORING, ED - Abnormal; Notable for the following components:   Glucose-Capillary 398 (*)    All other components within normal limits  LIPASE, BLOOD  RAPID URINE DRUG SCREEN, HOSP PERFORMED  AMMONIA                                                                                                                          Radiology CT ABDOMEN PELVIS W CONTRAST Result Date: 07/24/2024 CLINICAL DATA:  Epigastric pain EXAM: CT ABDOMEN AND PELVIS WITH CONTRAST TECHNIQUE: Multidetector CT imaging of the abdomen and pelvis was performed using the standard protocol following bolus administration of intravenous contrast. RADIATION DOSE REDUCTION: This exam was performed according to the departmental dose-optimization program which includes automated exposure control, adjustment of the mA and/or kV according to patient size and/or use of iterative reconstruction technique. CONTRAST:  OMNIPAQUE  IOHEXOL  350 MG/ML SOLN COMPARISON:  April 16, 2024 FINDINGS: Lower chest: No focal  airspace consolidation or pleural effusion. Hepatobiliary: No mass.Diffuse hepatic steatosis.No radiopaque stones or wall thickening of the gallbladder. No intrahepatic or extrahepatic biliary ductal dilation. The portal veins are patent. Pancreas: No mass or main ductal dilation. No peripancreatic inflammation or fluid collection. Spleen: Normal size. No mass. Adrenals/Urinary Tract: No adrenal masses. No renal mass. No nephrolithiasis or hydronephrosis. The urinary bladder is distended without focal abnormality. Stomach/Bowel: 2.4 cm fat containing mass in the wall of the gastric cardia, likely a lipoma. The stomach is decompressed without focal abnormality. No small bowel wall thickening or inflammation. No small bowel obstruction.Appendectomy. Vascular/Lymphatic: No aortic aneurysm. No intraabdominal or pelvic lymphadenopathy. Reproductive: No prostatomegaly.No free pelvic fluid. Other: No pneumoperitoneum, ascites, or mesenteric inflammation. Musculoskeletal: No acute fracture or destructive lesion. Multilevel thoracic osteophytosis. IMPRESSION: 1. No acute intra-abdominal or pelvic abnormality.  2. Diffuse hepatic steatosis. Electronically Signed   By: Rogelia Myers M.D.   On: 07/24/2024 11:48   CT ANGIO HEAD NECK W WO CM Result Date: 07/24/2024 CLINICAL DATA:  Provided history: Head trauma, abnormal mental status. Additional history provided: Headache, dizziness, tinnitus, recent syncope with head trauma, blurred vision. EXAM: CT ANGIOGRAPHY HEAD AND NECK WITH AND WITHOUT CONTRAST TECHNIQUE: Multidetector CT imaging of the head and neck was performed using the standard protocol during bolus administration of intravenous contrast. Multiplanar CT image reconstructions and MIPs were obtained to evaluate the vascular anatomy. Carotid stenosis measurements (when applicable) are obtained utilizing NASCET criteria, using the distal internal carotid diameter as the denominator. RADIATION DOSE REDUCTION: This exam  was performed according to the departmental dose-optimization program which includes automated exposure control, adjustment of the mA and/or kV according to patient size and/or use of iterative reconstruction technique. CONTRAST:  OMNIPAQUE  IOHEXOL  350 MG/ML SOLN COMPARISON:  None. FINDINGS: CT HEAD FINDINGS Brain: No age-advanced or lobar predominant cerebral atrophy. There is no acute intracranial hemorrhage. No demarcated cortical infarct. No extra-axial fluid collection. No evidence of an intracranial mass. No midline shift. Vascular: No hyperdense vessel. Skull: No calvarial fracture or aggressive osseous lesion. Sinuses/Orbits: No orbital mass or acute orbital finding. No significant paranasal sinus disease. Review of the MIP images confirms the above findings CTA NECK FINDINGS Aortic arch: Standard aortic branching. Visible portions of the thoracic aorta are normal in caliber. Streak/beam hardening artifact arising from a dense contrast bolus partially obscures the right subclavian artery. Within this limitation, there is no appreciable hemodynamically significant innominate or proximal subclavian artery stenosis. Right carotid system: The very proximal common carotid artery is obscured by streak/beam hardening artifact. Within this limitation, the common carotid and internal carotid arteries are patent within the neck without stenosis. Left carotid system: CCA and ICA patent within the neck without stenosis. Vertebral arteries: The left vertebral artery is non dominant and developmentally diminutive, but patent throughout the neck. The right vertebral artery is patent within the neck without stenosis. Skeleton: No acute fracture or aggressive osseous lesion. Other neck: 12 mm left thyroid lobe nodule (for instance as seen on series 12, image 168). Upper chest: No consolidation within the imaged lung apices. Review of the MIP images confirms the above findings CTA HEAD FINDINGS Anterior circulation: The  intracranial internal carotid arteries are patent. The M1 middle cerebral arteries are patent. No M2 proximal branch occlusion or high-grade proximal stenosis. The anterior cerebral arteries are patent. No intracranial aneurysm is identified. Posterior circulation: The intracranial vertebral arteries are patent. The basilar artery is patent. The posterior cerebral arteries are patent. Severe stenosis within a left PCA branch at the P2/P3 junction (series 16, image 55). Posterior communicating arteries are diminutive or absent, bilaterally. Venous sinuses: Within the limitations of contrast timing, no convincing thrombus. Anatomic variants: As described. Review of the MIP images confirms the above findings IMPRESSION: Non-contrast head CT: No evidence of an acute intracranial abnormality. CTA neck: 1. Streak/beam hardening artifact arising from a dense contrast bolus obscures the very proximal right common carotid artery. Within this limitation, the common carotid and internal carotid arteries are patent within the neck without stenosis. 2. The non-dominant left vertebral artery is developmentally diminutive, but patent throughout the neck. 3. The right vertebral artery is patent within the neck without stenosis. 4. 12 mm left thyroid lobe nodule. A non-emergent thyroid ultrasound is recommended for further evaluation. Reference: J Am Coll Radiol. 2015 Feb;12(2): 143-50. CTA head:  1. No proximal intracranial large vessel occlusion identified. 2. Severe stenosis within a left posterior cerebral artery branch at the P2/P3 junction. Electronically Signed   By: Rockey Childs D.O.   On: 07/24/2024 11:03   DG Chest Portable 1 View Result Date: 07/24/2024 CLINICAL DATA:  Tachycardia. EXAM: PORTABLE CHEST 1 VIEW COMPARISON:  04/14/2024. FINDINGS: Low lung volume. Note is made of elevated right hemidiaphragm. Bilateral lung fields are clear. Bilateral costophrenic angles are clear. Normal cardio-mediastinal silhouette. No  acute osseous abnormalities. The soft tissues are within normal limits. IMPRESSION: No active disease. Electronically Signed   By: Ree Molt M.D.   On: 07/24/2024 08:34    Pertinent labs & imaging results that were available during my care of the patient were reviewed by me and considered in my medical decision making (see MDM for details).  Medications Ordered in ED Medications  thiamine  (VITAMIN B1) tablet 100 mg (has no administration in time range)    Or  thiamine  (VITAMIN B1) injection 100 mg (has no administration in time range)  folic acid  (FOLVITE ) tablet 1 mg (1 mg Oral Given by Other 07/24/24 1022)  multivitamin with minerals tablet 1 tablet (has no administration in time range)  LORazepam  (ATIVAN ) tablet 1-4 mg (has no administration in time range)    Or  LORazepam  (ATIVAN ) tablet 2 mg (has no administration in time range)  LORazepam  (ATIVAN ) tablet 0-4 mg (1 mg Oral Given 07/24/24 1334)    Followed by  LORazepam  (ATIVAN ) tablet 0-4 mg (has no administration in time range)  0.9 % NaCl with KCl 20 mEq/ L  infusion ( Intravenous New Bag/Given 07/24/24 1501)  PHENobarbital  (LUMINAL) tablet 97.2 mg (has no administration in time range)  PHENobarbital  (LUMINAL) tablet 64.8 mg (has no administration in time range)  PHENobarbital  (LUMINAL) tablet 16.2 mg (has no administration in time range)  PHENobarbital  (LUMINAL) tablet 32.4 mg (has no administration in time range)  sodium chloride  0.9 % bolus 1,000 mL (1,000 mLs Intravenous New Bag/Given 07/24/24 0831)  LORazepam  (ATIVAN ) tablet 2 mg (2 mg Oral Given 07/24/24 0829)  PHENObarbital  (LUMINAL) injection 130 mg (130 mg Intravenous Given 07/24/24 0829)  thiamine  (VITAMIN B1) injection 100 mg (100 mg Intravenous Given 07/24/24 0828)  pantoprazole  (PROTONIX ) injection 40 mg (40 mg Intravenous Given 07/24/24 0828)  Tdap (BOOSTRIX) injection 0.5 mL (0.5 mLs Intramuscular Given 07/24/24 0831)  iohexol  (OMNIPAQUE ) 350 MG/ML injection 100 mL (100  mLs Intravenous Contrast Given 07/24/24 1035)  LORazepam  (ATIVAN ) tablet 2 mg (2 mg Oral Given 07/24/24 1022)  PHENObarbital  (LUMINAL) injection 130 mg (130 mg Intravenous Given 07/24/24 1237)  potassium chloride  SA (KLOR-CON  M) CR tablet 40 mEq (40 mEq Oral Given 07/24/24 1334)  magnesium  sulfate IVPB 2 g 50 mL (0 g Intravenous Stopped 07/24/24 1601)  Procedures .Critical Care  Performed by: Elnor Jayson LABOR, DO Authorized by: Elnor Jayson LABOR, DO   Critical care provider statement:    Critical care time (minutes):  30   Critical care time was exclusive of:  Separately billable procedures and treating other patients   Critical care was necessary to treat or prevent imminent or life-threatening deterioration of the following conditions: etoh withdrawal.   Critical care was time spent personally by me on the following activities:  Development of treatment plan with patient or surrogate, discussions with consultants, evaluation of patient's response to treatment, examination of patient, ordering and review of laboratory studies, ordering and review of radiographic studies, ordering and performing treatments and interventions, pulse oximetry, re-evaluation of patient's condition, review of old charts and obtaining history from patient or surrogate   Care discussed with: admitting provider     (including critical care time)  Medical Decision Making / ED Course    Medical Decision Making:    Luis Richards is a 33 y.o. male with past medical history as below, significant for chronic alcohol abuse, obesity, hepatic steatosis, pancreatitis who presents to the ED with complaint of head injury, alcohol withdrawal. The complaint involves an extensive differential diagnosis and also carries with it a high risk of complications and morbidity.  Serious etiology was considered.  Ddx includes but is not limited to: Differential diagnoses for head trauma includes subdural hematoma, epidural hematoma, acute concussion, traumatic subarachnoid hemorrhage, cerebral contusions, alcohol withdrawal, Warnicke's encephalopathy, pancreatitis, gastric ulcer etc.   Complete initial physical exam performed, notably the patient was in no acute distress but he is tremulous, tachycardic, appears to be acutely withdrawing.    Reviewed and confirmed nursing documentation for past medical history, family history, social history.  Vital signs reviewed.       Etoh withdrawal> - Daily alcohol, no alcohol in 2 days.  Usually drinks half a gallon a day - CIWA initially 17, he appears to be acute withdrawal - Give Ativan , phenobarbital  - He remains tremulous, tachycardic, still having perceptual disturbance, will redose phenobarb - Counseled patient regarding alcohol cessation - Recommend admission for acute alcohol withdrawal with perceptual disturbance   Fall w/ head injury> - Fall 2 days ago while intoxicated, headaches, intermittent confusion - Check CTA > PCA stenosis noted, rpt neuro exam non-focal, he has blurry vision but chronic per pt, reports he needs to get glasses >> spoke w/ neurology Dr Vanessa, 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, in absence of stable neuro exam w/o visual field cut/hemianopsia can be f/u o/p - possible concussion   Epigastric pain> - history of gastritis, daily etoh; likely etoh induced or at least exacerbated - denies bleeding, improved w/ ppi, CTAP stable - avoid etoh                     Additional history obtained: -Additional history obtained from family -External records from outside source obtained and reviewed including: Chart review including previous notes, labs, imaging, consultation notes including  Prior er visit Prior admit Prior meds   Lab Tests: -I ordered, reviewed, and interpreted labs.   The pertinent results  include:   Labs Reviewed  CBC WITH DIFFERENTIAL/PLATELET - Abnormal; Notable for the following components:      Result Value   Platelets 146 (*)    All other components within normal limits  COMPREHENSIVE METABOLIC PANEL WITH GFR - Abnormal; Notable for the following components:   Sodium 134 (*)    Potassium 3.3 (*)  Chloride 93 (*)    Glucose, Bld 358 (*)    Creatinine, Ser 0.54 (*)    AST 147 (*)    ALT 117 (*)    Total Bilirubin 1.6 (*)    All other components within normal limits  ETHANOL - Abnormal; Notable for the following components:   Alcohol, Ethyl (B) 69 (*)    All other components within normal limits  URINALYSIS, ROUTINE W REFLEX MICROSCOPIC - Abnormal; Notable for the following components:   Specific Gravity, Urine 1.042 (*)    Glucose, UA >=500 (*)    Ketones, ur 20 (*)    Protein, ur 100 (*)    All other components within normal limits  MAGNESIUM  - Abnormal; Notable for the following components:   Magnesium  1.3 (*)    All other components within normal limits  PHOSPHORUS - Abnormal; Notable for the following components:   Phosphorus 2.3 (*)    All other components within normal limits  HEMOGLOBIN A1C - Abnormal; Notable for the following components:   Hgb A1c MFr Bld 9.3 (*)    All other components within normal limits  CBG MONITORING, ED - Abnormal; Notable for the following components:   Glucose-Capillary 398 (*)    All other components within normal limits  LIPASE, BLOOD  RAPID URINE DRUG SCREEN, HOSP PERFORMED  AMMONIA    Notable for stable labs, mild dehydration noted  EKG   EKG Interpretation Date/Time:  Tuesday July 24 2024 08:44:57 EDT Ventricular Rate:  108 PR Interval:  153 QRS Duration:  107 QT Interval:  368 QTC Calculation: 494 R Axis:   85  Text Interpretation: Sinus tachycardia Prolonged QT interval Baseline wander in lead(s) V5 similar to prior Confirmed by Elnor Savant ([id number]) on 07/24/2024 12:08:33 PM         Imaging  Studies ordered: I ordered imaging studies including CTA head/neck, CTAP CXR I independently visualized the following imaging with scope of interpretation limited to determining acute life threatening conditions related to emergency care; findings noted above I agree with the radiologist interpretation If any imaging was obtained with contrast I closely monitored patient for any possible adverse reaction a/w contrast administration in the emergency department   Medicines ordered and prescription drug management: Meds ordered this encounter  Medications   sodium chloride  0.9 % bolus 1,000 mL   LORazepam  (ATIVAN ) tablet 2 mg   PHENObarbital  (LUMINAL) injection 130 mg   thiamine  (VITAMIN B1) injection 100 mg   pantoprazole  (PROTONIX ) injection 40 mg   Tdap (BOOSTRIX) injection 0.5 mL   iohexol  (OMNIPAQUE ) 350 MG/ML injection 100 mL   DISCONTD: LORazepam  (ATIVAN ) tablet 1-4 mg    CIWA-AR < 5 =:   0 mg    CIWA-AR 5 -10 =:   1 mg    CIWA-AR 11 -15 =:   2 mg    CIWA-AR 16 -20 =:   3 mg    CIWA-AR 16 -20 =:   Recheck CIWA-AR in 1 hour; if > 20 notify MD    CIWA-AR > 20 =:   4 mg    CIWA-AR > 20 =:   Call Rapid Response   DISCONTD: LORazepam  (ATIVAN ) injection 1-4 mg    CIWA-AR < 5 =:   0 mg    CIWA-AR 5 -10 =:   1 mg    CIWA-AR 11 -15 =:   2 mg    CIWA-AR 16 -20 =:   3 mg    CIWA-AR 16 -20 =:  Recheck CIWA-AR in 1 hour; if > 20 notify MD    CIWA-AR > 20 =:   4 mg    CIWA-AR > 20 =:   Call Rapid Response   OR Linked Order Group    thiamine  (VITAMIN B1) tablet 100 mg    thiamine  (VITAMIN B1) injection 100 mg   folic acid  (FOLVITE ) tablet 1 mg   multivitamin with minerals tablet 1 tablet   LORazepam  (ATIVAN ) tablet 2 mg   PHENObarbital  (LUMINAL) injection 130 mg   OR Linked Order Group    LORazepam  (ATIVAN ) tablet 1-4 mg     CIWA-AR < 5 =:   0 mg     CIWA-AR 5 -10 =:   1 mg     CIWA-AR 11 -15 =:   2 mg     CIWA-AR 16 -20 =:   3 mg     CIWA-AR 16 -20 =:   Recheck CIWA-AR in 1 hour;  if > 20 notify MD     CIWA-AR > 20 =:   4 mg     CIWA-AR > 20 =:   Call Rapid Response    LORazepam  (ATIVAN ) tablet 2 mg   FOLLOWED BY Linked Order Group    LORazepam  (ATIVAN ) tablet 0-4 mg     CIWA-AR < 5 =:   0 mg     CIWA-AR 5 -10 =:   1 mg     CIWA-AR 11 -15 =:   2 mg     CIWA-AR 16 -20 =:   3 mg     CIWA-AR 16 -20 =:   Recheck CIWA-AR in 1 hour; if > 20 notify MD     CIWA-AR > 20 =:   4 mg     CIWA-AR > 20 =:   Call Rapid Response    LORazepam  (ATIVAN ) tablet 0-4 mg     CIWA-AR < 5 =:   0 mg     CIWA-AR 5 -10 =:   1 mg     CIWA-AR 11 -15 =:   2 mg     CIWA-AR 16 -20 =:   3 mg     CIWA-AR 16 -20 =:   Recheck CIWA-AR in 1 hour; if > 20 notify MD     CIWA-AR > 20 =:   4 mg     CIWA-AR > 20 =:   Call Rapid Response   potassium chloride  SA (KLOR-CON  M) CR tablet 40 mEq   0.9 % NaCl with KCl 20 mEq/ L  infusion   PHENobarbital  (LUMINAL) tablet 97.2 mg   PHENobarbital  (LUMINAL) tablet 64.8 mg   PHENobarbital  (LUMINAL) tablet 16.2 mg   PHENobarbital  (LUMINAL) tablet 32.4 mg   magnesium  sulfate IVPB 2 g 50 mL   DISCONTD: labetalol  (NORMODYNE ) injection 20 mg   DISCONTD: labetalol  (NORMODYNE ) injection 20 mg    -I have reviewed the patients home medicines and have made adjustments as needed   Consultations Obtained: I requested consultation with the [facility],  and discussed lab and imaging findings as well as pertinent plan    Cardiac Monitoring: The patient was maintained on a cardiac monitor.  I personally viewed and interpreted the cardiac monitored which showed an underlying rhythm of: sinus tachycardia Continuous pulse oximetry interpreted by myself, 95% on RA.    Social Determinants of Health:  Diagnosis or treatment significantly limited by social determinants of health: former smoker, obesity, and alcohol use   Reevaluation: After the interventions noted above, I reevaluated the  patient and found that they have improved  Co morbidities that complicate the patient  evaluation  Past Medical History:  Diagnosis Date   Alcohol abuse    Fatty liver    Morbid obesity (HCC)    Vitamin D deficiency       Dispostion: Disposition decision including need for hospitalization was considered, and patient admitted to the hospital.    Final Clinical Impression(s) / ED Diagnoses Final diagnoses:  Alcohol withdrawal syndrome with perceptual disturbance (HCC)  Injury of head, initial encounter  Thyroid nodule  Elevated liver enzymes  Hepatic steatosis        Elnor Jayson LABOR, DO 07/24/24 1220    Elnor Jayson LABOR, DO 07/24/24 1603    Elnor Jayson LABOR, DO 07/24/24 1617

## 2024-07-24 NOTE — ED Triage Notes (Addendum)
 Patient comes in for headache, dizziness, and hearing ringing and noise in head after hitting head 3 days ago at home when he passed out. Laceration that has scabbed over on lower lip. Patient hasn't had any alcohol in two or three days shaking upon arrival noted. Blurred vision stated by patient. Patient complains of blood stool prior to arrival this morning.

## 2024-07-25 DIAGNOSIS — F10232 Alcohol dependence with withdrawal with perceptual disturbance: Secondary | ICD-10-CM | POA: Diagnosis not present

## 2024-07-25 DIAGNOSIS — R9431 Abnormal electrocardiogram [ECG] [EKG]: Secondary | ICD-10-CM

## 2024-07-25 DIAGNOSIS — E1165 Type 2 diabetes mellitus with hyperglycemia: Secondary | ICD-10-CM

## 2024-07-25 DIAGNOSIS — E876 Hypokalemia: Secondary | ICD-10-CM

## 2024-07-25 DIAGNOSIS — E66813 Obesity, class 3: Secondary | ICD-10-CM | POA: Diagnosis not present

## 2024-07-25 DIAGNOSIS — D696 Thrombocytopenia, unspecified: Secondary | ICD-10-CM

## 2024-07-25 DIAGNOSIS — R7989 Other specified abnormal findings of blood chemistry: Secondary | ICD-10-CM

## 2024-07-25 DIAGNOSIS — R7401 Elevation of levels of liver transaminase levels: Secondary | ICD-10-CM

## 2024-07-25 LAB — COMPREHENSIVE METABOLIC PANEL WITH GFR
ALT: 88 U/L — ABNORMAL HIGH (ref 0–44)
AST: 93 U/L — ABNORMAL HIGH (ref 15–41)
Albumin: 3.7 g/dL (ref 3.5–5.0)
Alkaline Phosphatase: 75 U/L (ref 38–126)
Anion gap: 12 (ref 5–15)
BUN: 8 mg/dL (ref 6–20)
CO2: 25 mmol/L (ref 22–32)
Calcium: 8.5 mg/dL — ABNORMAL LOW (ref 8.9–10.3)
Chloride: 96 mmol/L — ABNORMAL LOW (ref 98–111)
Creatinine, Ser: 0.59 mg/dL — ABNORMAL LOW (ref 0.61–1.24)
GFR, Estimated: >60 mL/min (ref >60)
Glucose, Bld: 250 mg/dL — ABNORMAL HIGH (ref 70–99)
Potassium: 3.4 mmol/L — ABNORMAL LOW (ref 3.5–5.1)
Sodium: 133 mmol/L — ABNORMAL LOW (ref 135–145)
Total Bilirubin: 2.8 mg/dL — ABNORMAL HIGH (ref 0.0–1.2)
Total Protein: 7.4 g/dL (ref 6.5–8.1)

## 2024-07-25 LAB — MAGNESIUM: Magnesium: 1.7 mg/dL (ref 1.7–2.4)

## 2024-07-25 LAB — GLUCOSE, CAPILLARY
Glucose-Capillary: 242 mg/dL — ABNORMAL HIGH (ref 70–99)
Glucose-Capillary: 343 mg/dL — ABNORMAL HIGH (ref 70–99)
Glucose-Capillary: 191 mg/dL — ABNORMAL HIGH (ref 70–99)
Glucose-Capillary: 280 mg/dL — ABNORMAL HIGH (ref 70–99)

## 2024-07-25 LAB — CBC
HCT: 46.4 % (ref 39.0–52.0)
Hemoglobin: 15.4 g/dL (ref 13.0–17.0)
MCH: 33.1 pg (ref 26.0–34.0)
MCHC: 33.2 g/dL (ref 30.0–36.0)
MCV: 99.8 fL (ref 80.0–100.0)
Platelets: 94 K/uL — ABNORMAL LOW (ref 150–400)
RBC: 4.65 MIL/uL (ref 4.22–5.81)
RDW: 12 % (ref 11.5–15.5)
WBC: 3.9 K/uL — ABNORMAL LOW (ref 4.0–10.5)
nRBC: 0 % (ref 0.0–0.2)

## 2024-07-25 LAB — PHOSPHORUS: Phosphorus: 3.2 mg/dL (ref 2.5–4.6)

## 2024-07-25 LAB — HIV ANTIBODY (ROUTINE TESTING W REFLEX): HIV Screen 4th Generation wRfx: NONREACTIVE

## 2024-07-25 MED ORDER — INSULIN STARTER KIT- PEN NEEDLES (ENGLISH)
1.0000 | Freq: Once | Status: AC
  Administered 2024-07-26: 1
  Filled 2024-07-25: qty 1

## 2024-07-25 MED ORDER — INSULIN ASPART 100 UNIT/ML IJ SOLN
4.0000 [IU] | Freq: Three times a day (TID) | INTRAMUSCULAR | Status: DC
  Administered 2024-07-25 – 2024-07-28 (×10): 4 [IU] via SUBCUTANEOUS

## 2024-07-25 MED ORDER — INSULIN GLARGINE-YFGN 100 UNIT/ML ~~LOC~~ SOLN
20.0000 [IU] | Freq: Every day | SUBCUTANEOUS | Status: DC
  Administered 2024-07-25 – 2024-07-27 (×3): 20 [IU] via SUBCUTANEOUS
  Filled 2024-07-25 (×4): qty 0.2

## 2024-07-25 MED ORDER — LORAZEPAM 2 MG/ML IJ SOLN: 1.0000 mg | Freq: Four times a day (QID) | INTRAMUSCULAR | Status: DC | PRN

## 2024-07-25 MED ORDER — LIVING WELL WITH DIABETES BOOK
Freq: Once | Status: AC
  Filled 2024-07-25: qty 1

## 2024-07-25 MED ORDER — METOPROLOL TARTRATE 25 MG PO TABS
25.0000 mg | ORAL_TABLET | Freq: Two times a day (BID) | ORAL | Status: DC
  Administered 2024-07-25 – 2024-07-28 (×7): 25 mg via ORAL
  Filled 2024-07-25 (×7): qty 1

## 2024-07-25 MED ORDER — HYDRALAZINE HCL 20 MG/ML IJ SOLN
10.0000 mg | Freq: Four times a day (QID) | INTRAMUSCULAR | Status: DC | PRN
  Administered 2024-07-25: 10 mg via INTRAVENOUS
  Filled 2024-07-25: qty 1

## 2024-07-25 NOTE — Plan of Care (Signed)
  Problem: Education: Goal: Ability to describe self-care measures that may prevent or decrease complications (Diabetes Survival Skills Education) will improve Outcome: Progressing   Problem: Coping: Goal: Ability to adjust to condition or change in health will improve Outcome: Progressing   Problem: Clinical Measurements: Goal: Will remain free from infection Outcome: Progressing   Problem: Coping: Goal: Level of anxiety will decrease Outcome: Progressing   Problem: Elimination: Goal: Will not experience complications related to bowel motility Outcome: Progressing Goal: Will not experience complications related to urinary retention Outcome: Progressing   Problem: Safety: Goal: Ability to remain free from injury will improve Outcome: Progressing

## 2024-07-25 NOTE — Progress Notes (Signed)
 Progress Note   Patient: Luis Richards FMW:[id number] DOB: 1991/03/31 DOA: 07/24/2024     1 DOS: the patient was seen and examined on 07/25/2024   Brief hospital course: Kayshawn A Lamboy is a 33 y.o. male with medical history significant of class III obesity, vitamin D deficiency, hepatic steatosis, alcohol dependence (has been drinking about half a gallon a day liquor), history of alcoholic pancreatitis, tobacco use, history of alcohol withdrawal who presented to the emergency department complaints of headache, dizziness and tinnitus after hitting his head 3 days ago when he passed out.  The patient also sustained a laceration over his lower lip which is not bleeding.  He also saw blood in his stools earlier today.  He stated that he has not had alcohol in the last 2 days.   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  Patient is started on IV fluid bolus, placed on CIWA protocol, electrolytes replaced, admitted to [facility] service for further management evaluation  Assessment and Plan: Alcohol withdrawal: Patient does have significant withdrawal symptoms, requiring IV Ativan . Continue CIWA protocol with Ativan . Continue phenobarbital  taper. Continue to monitor electrolytes and replace accordingly.  Hyponatremia- In the setting of alcohol use, elevated blood sugars Received IV fluids.  Continue to monitor sugars and adjust insulin . Trend sodium.  Hypokalemia- Oral potassium replacement as ordered. Magnesium  improved with replacement.  Hypophosphatemia-neutra phos TID ordered. Recheck phos level.  Alcohol abuse: Alcoholic hepatitis- Patient's LFTs elevated, bilirubin 2.8. Trend LFTs.  RUQ sono 3 months ago showed hepatic steatosis. Continue thiamine , folate and multivitamin. Discussed about ill  effects of alcohol. TOC consult for substance abuse.  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 MRI brain unremarkable. No further work up recommended.  Type 2 diabetes mellitus- A1c 9.3. Continue Accu-Cheks, sliding scale insulin . Diabetes educator consulted. Started him on Semglee  20 units, Premeal 4 units 3 times daily. Will continue to adjust insulin  accordingly.  Morbid obesity- BMI 45.61. Diet, exercise and weight reduction advised.   Out of bed to chair. Incentive spirometry. Nursing supportive care. Fall, aspiration precautions. Diet:  Diet Orders (From admission, onward)     Start     Ordered   07/24/24 1625  Diet heart healthy/carb modified Room service appropriate? Yes; Fluid consistency: Thin  Diet effective now       Question Answer Comment  Diet-HS Snack? Nothing   Room service appropriate? Yes   Fluid consistency: Thin      07/24/24 1625           DVT prophylaxis: SCDs Start: 07/24/24 1625  Level of care: Stepdown   Code Status: Full Code  Subjective: Patient is seen and examined today morning. He is able to answer me, does have hand tremors. BP higher side. RN notified that CIWA score high this afternoon, requiring IV ativan .   Physical Exam: Vitals:   07/25/24 1205 07/25/24 1245 07/25/24 1246 07/25/24 1300  BP: (!) 162/111 (!) 186/101 (!) 183/94   Pulse:  (!) 115 (!) 110 88  Resp:  20  19  Temp:      TempSrc:      SpO2:  98%  98%  Weight:      Height:        General - Elderly obese Caucasian male, no apparent distress HEENT -  PERRLA, EOMI, atraumatic head, non tender sinuses. Lung - Clear, basal rales, rhonchi, wheezes. Heart - S1, S2 heard, no murmurs, rubs, trace pedal edema. Abdomen - Soft, non tender, bowel sounds good Neuro - Alert, awake and oriented x 3, non focal exam. Skin - Warm and dry.  Data Reviewed:      Latest Ref Rng & Units 07/25/2024    3:29 AM  07/24/2024    8:09 AM 04/16/2024    8:35 AM  CBC  WBC 4.0 - 10.5 K/uL 3.9  4.6  4.1   Hemoglobin 13.0 - 17.0 g/dL 84.5  83.6  84.7   Hematocrit 39.0 - 52.0 % 46.4  48.4  45.2   Platelets 150 - 400 K/uL 94  146  163       Latest Ref Rng & Units 07/25/2024    3:29 AM 07/24/2024    8:09 AM 04/16/2024    8:35 AM  BMP  Glucose 70 - 99 mg/dL 749  641  780   BUN 6 - 20 mg/dL 8  6  <5   Creatinine 9.38 - 1.24 mg/dL 9.40  9.45  9.39   Sodium 135 - 145 mmol/L 133  134  140   Potassium 3.5 - 5.1 mmol/L 3.4  3.3  3.6   Chloride 98 - 111 mmol/L 96  93  101   CO2 22 - 32 mmol/L 25  26  25    Calcium 8.9 - 10.3 mg/dL 8.5  8.9  9.0    MR BRAIN WO CONTRAST Result Date: 07/24/2024 CLINICAL DATA:  Provided history: Abnormal head CT. EXAM: MRI HEAD WITHOUT CONTRAST TECHNIQUE: Multiplanar, multiecho pulse sequences of the brain and surrounding structures were obtained without intravenous contrast. COMPARISON:  Non-contrast head CT and CT angiogram head/neck performed earlier today 07/24/2024. FINDINGS: Brain: No age-advanced or lobar predominant cerebral atrophy. No cortical encephalomalacia is identified. No significant cerebral white matter disease. There is no acute infarct. No evidence of an intracranial mass. No chronic intracranial blood products. No extra-axial fluid collection. No midline shift. Vascular: Please see CTA head/neck performed earlier today. Skull and upper cervical spine: No focal worrisome marrow lesion. Sinuses/Orbits: No mass or acute finding within the imaged orbits. No significant paranasal sinus disease. IMPRESSION: Unremarkable non-contrast MRI appearance of the brain. No evidence of an acute intracranial abnormality. Electronically Signed   By: Rockey Childs D.O.   On: 07/24/2024 18:43   CT ABDOMEN PELVIS W CONTRAST Result Date: 07/24/2024 CLINICAL DATA:  Epigastric pain EXAM: CT ABDOMEN AND PELVIS WITH CONTRAST TECHNIQUE: Multidetector CT imaging of the abdomen and pelvis was performed  using the standard protocol following bolus administration of intravenous contrast. RADIATION DOSE REDUCTION: This exam was performed according to the departmental dose-optimization program which includes automated exposure control, adjustment of the mA and/or kV according to patient size and/or use of iterative reconstruction technique. CONTRAST:  OMNIPAQUE  IOHEXOL  350 MG/ML SOLN COMPARISON:  April 16, 2024 FINDINGS: Lower chest: No focal airspace consolidation or pleural effusion. Hepatobiliary: No mass.Diffuse hepatic steatosis.No radiopaque stones or wall thickening of the gallbladder. No intrahepatic or extrahepatic biliary ductal dilation. The portal veins are patent. Pancreas: No mass or main ductal dilation. No peripancreatic inflammation or fluid collection. Spleen: Normal size. No mass. Adrenals/Urinary Tract: No adrenal masses. No renal mass. No nephrolithiasis or hydronephrosis. The urinary bladder is distended without focal abnormality. Stomach/Bowel: 2.4 cm fat containing mass in the wall of the gastric cardia, likely a lipoma. The stomach is decompressed without focal abnormality.  No small bowel wall thickening or inflammation. No small bowel obstruction.Appendectomy. Vascular/Lymphatic: No aortic aneurysm. No intraabdominal or pelvic lymphadenopathy. Reproductive: No prostatomegaly.No free pelvic fluid. Other: No pneumoperitoneum, ascites, or mesenteric inflammation. Musculoskeletal: No acute fracture or destructive lesion. Multilevel thoracic osteophytosis. IMPRESSION: 1. No acute intra-abdominal or pelvic abnormality. 2. Diffuse hepatic steatosis. Electronically Signed   By: Rogelia Myers M.D.   On: 07/24/2024 11:48   CT ANGIO HEAD NECK W WO CM Result Date: 07/24/2024 CLINICAL DATA:  Provided history: Head trauma, abnormal mental status. Additional history provided: Headache, dizziness, tinnitus, recent syncope with head trauma, blurred vision. EXAM: CT ANGIOGRAPHY HEAD AND NECK WITH AND  WITHOUT CONTRAST TECHNIQUE: Multidetector CT imaging of the head and neck was performed using the standard protocol during bolus administration of intravenous contrast. Multiplanar CT image reconstructions and MIPs were obtained to evaluate the vascular anatomy. Carotid stenosis measurements (when applicable) are obtained utilizing NASCET criteria, using the distal internal carotid diameter as the denominator. RADIATION DOSE REDUCTION: This exam was performed according to the departmental dose-optimization program which includes automated exposure control, adjustment of the mA and/or kV according to patient size and/or use of iterative reconstruction technique. CONTRAST:  OMNIPAQUE  IOHEXOL  350 MG/ML SOLN COMPARISON:  None. FINDINGS: CT HEAD FINDINGS Brain: No age-advanced or lobar predominant cerebral atrophy. There is no acute intracranial hemorrhage. No demarcated cortical infarct. No extra-axial fluid collection. No evidence of an intracranial mass. No midline shift. Vascular: No hyperdense vessel. Skull: No calvarial fracture or aggressive osseous lesion. Sinuses/Orbits: No orbital mass or acute orbital finding. No significant paranasal sinus disease. Review of the MIP images confirms the above findings CTA NECK FINDINGS Aortic arch: Standard aortic branching. Visible portions of the thoracic aorta are normal in caliber. Streak/beam hardening artifact arising from a dense contrast bolus partially obscures the right subclavian artery. Within this limitation, there is no appreciable hemodynamically significant innominate or proximal subclavian artery stenosis. Right carotid system: The very proximal common carotid artery is obscured by streak/beam hardening artifact. Within this limitation, the common carotid and internal carotid arteries are patent within the neck without stenosis. Left carotid system: CCA and ICA patent within the neck without stenosis. Vertebral arteries: The left vertebral artery is non  dominant and developmentally diminutive, but patent throughout the neck. The right vertebral artery is patent within the neck without stenosis. Skeleton: No acute fracture or aggressive osseous lesion. Other neck: 12 mm left thyroid lobe nodule (for instance as seen on series 12, image 168). Upper chest: No consolidation within the imaged lung apices. Review of the MIP images confirms the above findings CTA HEAD FINDINGS Anterior circulation: The intracranial internal carotid arteries are patent. The M1 middle cerebral arteries are patent. No M2 proximal branch occlusion or high-grade proximal stenosis. The anterior cerebral arteries are patent. No intracranial aneurysm is identified. Posterior circulation: The intracranial vertebral arteries are patent. The basilar artery is patent. The posterior cerebral arteries are patent. Severe stenosis within a left PCA branch at the P2/P3 junction (series 16, image 55). Posterior communicating arteries are diminutive or absent, bilaterally. Venous sinuses: Within the limitations of contrast timing, no convincing thrombus. Anatomic variants: As described. Review of the MIP images confirms the above findings IMPRESSION: Non-contrast head CT: No evidence of an acute intracranial abnormality. CTA neck: 1. Streak/beam hardening artifact arising from a dense contrast bolus obscures the very proximal right common carotid artery. Within this limitation, the common carotid and internal carotid arteries are patent within the neck without stenosis. 2.  The non-dominant left vertebral artery is developmentally diminutive, but patent throughout the neck. 3. The right vertebral artery is patent within the neck without stenosis. 4. 12 mm left thyroid lobe nodule. A non-emergent thyroid ultrasound is recommended for further evaluation. Reference: J Am Coll Radiol. 2015 Feb;12(2): 143-50. CTA head: 1. No proximal intracranial large vessel occlusion identified. 2. Severe stenosis within a left  posterior cerebral artery branch at the P2/P3 junction. Electronically Signed   By: Rockey Childs D.O.   On: 07/24/2024 11:03   DG Chest Portable 1 View Result Date: 07/24/2024 CLINICAL DATA:  Tachycardia. EXAM: PORTABLE CHEST 1 VIEW COMPARISON:  04/14/2024. FINDINGS: Low lung volume. Note is made of elevated right hemidiaphragm. Bilateral lung fields are clear. Bilateral costophrenic angles are clear. Normal cardio-mediastinal silhouette. No acute osseous abnormalities. The soft tissues are within normal limits. IMPRESSION: No active disease. Electronically Signed   By: Ree Molt M.D.   On: 07/24/2024 08:34   Family Communication: Discussed with patient, he understand and agree. All questions answered.  Disposition: Status is: Inpatient Remains inpatient appropriate because: alcohol withdrawal.  Planned Discharge Destination: Home     Time spent: 51 minutes  Author: Concepcion Riser, MD 07/25/2024 2:44 PM Secure chat 7am to 7pm For on call review [email or web address].

## 2024-07-25 NOTE — Inpatient Diabetes Management (Signed)
 Inpatient Diabetes Program Recommendations  AACE/ADA: New Consensus Statement on Inpatient Glycemic Control (2015)  Target Ranges:  Prepandial:   less than 140 mg/dL      Peak postprandial:   less than 180 mg/dL (1-2 hours)      Critically ill patients:  140 - 180 mg/dL   Lab Results  Component Value Date   GLUCAP 343 (H) 07/25/2024   HGBA1C 9.3 (H) 07/24/2024    Review of Glycemic Control  Diabetes history: New-onset DM Outpatient Diabetes medications: None Current orders for Inpatient glycemic control: Novolog  0-20 TID with meals and 0-5 HS  HgbA1C - 9.3%  Inpatient Diabetes Program Recommendations:    May need basal insulin  - Semglee  20 units daily. Titrate until FBS < 180 mg/dL  Consider adding Novolog  4 units TID with meals if eating > 50%  Spoke with pt at bedside regarding new diabetes diagnosis.  Discussed A1C results (9.3%) and explained what an A1C is and informed patient that his current A1C indicates an average glucose of  mg/dl over the past 2-3 months. Discussed basic pathophysiology of DM Type 2, basic home care, importance of checking CBGs and maintaining good CBG control to prevent long-term and short-term complications. Reviewed glucose and A1C goals. Reviewed signs and symptoms of hyperglycemia and hypoglycemia along with treatment for both. Discussed impact of nutrition, exercise, stress, sickness, and medications on diabetes control. Ordered Living Well with diabetes booklet and encouraged patient to read through entire book. Discussed possibility of going home on insulin . Pt would prefer oral meds, if possible. States he has + family hx DM on paternal side. Has lost over 100 pounds by decreasing portions and eating healthier. Needs PCP to manage his diabetes when discharged. Will order TOC to assist with obtaining PCP.  Will f/u in am.   Thank you. Shona Brandy, RD, LDN, CDCES Inpatient Diabetes Coordinator [phone]

## 2024-07-26 ENCOUNTER — Telehealth (HOSPITAL_COMMUNITY): Payer: Self-pay | Admitting: Pharmacy Technician

## 2024-07-26 ENCOUNTER — Other Ambulatory Visit (HOSPITAL_COMMUNITY): Payer: Self-pay

## 2024-07-26 DIAGNOSIS — R7989 Other specified abnormal findings of blood chemistry: Secondary | ICD-10-CM | POA: Diagnosis not present

## 2024-07-26 DIAGNOSIS — K7010 Alcoholic hepatitis without ascites: Secondary | ICD-10-CM

## 2024-07-26 DIAGNOSIS — F10232 Alcohol dependence with withdrawal with perceptual disturbance: Secondary | ICD-10-CM | POA: Diagnosis not present

## 2024-07-26 DIAGNOSIS — E876 Hypokalemia: Secondary | ICD-10-CM | POA: Diagnosis not present

## 2024-07-26 LAB — CBC
HCT: 45.6 % (ref 39.0–52.0)
Hemoglobin: 15.3 g/dL (ref 13.0–17.0)
MCH: 33.7 pg (ref 26.0–34.0)
MCHC: 33.6 g/dL (ref 30.0–36.0)
MCV: 100.4 fL — ABNORMAL HIGH (ref 80.0–100.0)
Platelets: 88 K/uL — ABNORMAL LOW (ref 150–400)
RBC: 4.54 MIL/uL (ref 4.22–5.81)
RDW: 12 % (ref 11.5–15.5)
WBC: 5.2 K/uL (ref 4.0–10.5)
nRBC: 0 % (ref 0.0–0.2)

## 2024-07-26 LAB — GLUCOSE, CAPILLARY
Glucose-Capillary: 193 mg/dL — ABNORMAL HIGH (ref 70–99)
Glucose-Capillary: 198 mg/dL — ABNORMAL HIGH (ref 70–99)
Glucose-Capillary: 204 mg/dL — ABNORMAL HIGH (ref 70–99)
Glucose-Capillary: 208 mg/dL — ABNORMAL HIGH (ref 70–99)

## 2024-07-26 LAB — BASIC METABOLIC PANEL WITH GFR
Anion gap: 16 — ABNORMAL HIGH (ref 5–15)
BUN: 9 mg/dL (ref 6–20)
CO2: 23 mmol/L (ref 22–32)
Calcium: 8.7 mg/dL — ABNORMAL LOW (ref 8.9–10.3)
Chloride: 97 mmol/L — ABNORMAL LOW (ref 98–111)
Creatinine, Ser: 0.35 mg/dL — ABNORMAL LOW (ref 0.61–1.24)
GFR, Estimated: >60 mL/min (ref >60)
Glucose, Bld: 184 mg/dL — ABNORMAL HIGH (ref 70–99)
Potassium: 2.9 mmol/L — ABNORMAL LOW (ref 3.5–5.1)
Sodium: 136 mmol/L (ref 135–145)

## 2024-07-26 LAB — HEPATIC FUNCTION PANEL
ALT: 80 U/L — ABNORMAL HIGH (ref 0–44)
AST: 72 U/L — ABNORMAL HIGH (ref 15–41)
Albumin: 3.7 g/dL (ref 3.5–5.0)
Alkaline Phosphatase: 67 U/L (ref 38–126)
Bilirubin, Direct: 0.8 mg/dL — ABNORMAL HIGH (ref 0.0–0.2)
Indirect Bilirubin: 1.4 mg/dL — ABNORMAL HIGH (ref 0.3–0.9)
Total Bilirubin: 2.2 mg/dL — ABNORMAL HIGH (ref 0.0–1.2)
Total Protein: 7.3 g/dL (ref 6.5–8.1)

## 2024-07-26 LAB — PHOSPHORUS: Phosphorus: 4.2 mg/dL (ref 2.5–4.6)

## 2024-07-26 LAB — MAGNESIUM: Magnesium: 1.6 mg/dL — ABNORMAL LOW (ref 1.7–2.4)

## 2024-07-26 MED ORDER — MELATONIN 5 MG PO TABS
5.0000 mg | ORAL_TABLET | Freq: Every evening | ORAL | Status: DC | PRN
  Administered 2024-07-26 – 2024-07-27 (×2): 5 mg via ORAL
  Filled 2024-07-26 (×2): qty 1

## 2024-07-26 MED ORDER — MAGNESIUM OXIDE -MG SUPPLEMENT 400 (240 MG) MG PO TABS
400.0000 mg | ORAL_TABLET | Freq: Every day | ORAL | Status: AC
  Administered 2024-07-26 – 2024-07-28 (×3): 400 mg via ORAL
  Filled 2024-07-26 (×3): qty 1

## 2024-07-26 MED ORDER — POTASSIUM CHLORIDE 10 MEQ/100ML IV SOLN
10.0000 meq | INTRAVENOUS | Status: AC
  Administered 2024-07-26 (×4): 10 meq via INTRAVENOUS
  Filled 2024-07-26 (×4): qty 100

## 2024-07-26 NOTE — Progress Notes (Signed)
 Progress Note   Patient: Luis Richards:[id number] DOB: 1990/12/31 DOA: 07/24/2024     2 DOS: the patient was seen and examined on 07/26/2024   Brief hospital course: Bakari A Rzepka is a 33 y.o. male with medical history significant of class III obesity, vitamin D deficiency, hepatic steatosis, alcohol dependence (has been drinking about half a gallon a day liquor), history of alcoholic pancreatitis, tobacco use, history of alcohol withdrawal who presented to the emergency department complaints of headache, dizziness and tinnitus after hitting his head 3 days ago when he passed out.  The patient also sustained a laceration over his lower lip which is not bleeding.  He also saw blood in his stools earlier today.  He stated that he has not had alcohol in the last 2 days.   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  Patient is started on IV fluid bolus, placed on CIWA protocol, electrolytes replaced, admitted to [facility] service for further management evaluation  Assessment and Plan: Alcohol withdrawal: Patient does have significant withdrawal symptoms, requiring IV Ativan . Continue CIWA protocol with Ativan . Continue phenobarbital  taper. Continue to monitor electrolytes and replace accordingly.  Pseudohyponatremia- Improved with blood sugar control. Continue to monitor sugars and adjust insulin . Trend sodium.  Hypokalemia- K 2.9 today. IV potassium replacement as ordered. Oral magnesium  replacement ordered.  Hypophosphatemia-Resolved  Alcohol abuse: Alcoholic hepatitis- Patient's LFTs elevated, repeat LFTs ordered.  RUQ sono 3 months ago showed hepatic steatosis. Continue thiamine , folate and multivitamin. Discussed about ill effects of alcohol. TOC consult for substance abuse.  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 MRI brain unremarkable. No further work up recommended.  Type 2 diabetes mellitus- A1c 9.3. new onset. Continue Accu-Cheks, sliding scale insulin . Diabetes educator on board. Continue to adjust Semglee  20 units, Premeal 4 units 3 times daily.  Morbid obesity- BMI 45.61. Diet, exercise and weight reduction advised.   Out of bed to chair. Incentive spirometry. Nursing supportive care. Fall, aspiration precautions. Diet:  Diet Orders (From admission, onward)     Start     Ordered   07/24/24 1625  Diet heart healthy/carb modified Room service appropriate? Yes; Fluid consistency: Thin  Diet effective now       Question Answer Comment  Diet-HS Snack? Nothing   Room service appropriate? Yes   Fluid consistency: Thin      07/24/24 1625           DVT prophylaxis: SCDs Start: 07/24/24 1625  Level of care: Stepdown   Code Status: Full Code  Subjective: Patient is seen and examined today morning. He denies any complaints. Asks if he can go. Advised another day of stay given elevated BP, HR and withdrawal symptoms.   Physical Exam: Vitals:   07/26/24 0600 07/26/24 0715 07/26/24 0755 07/26/24 0800  BP: (!) 184/108 (!) 152/87  (!) 122/91  Pulse: (!) 113 (!) 106  (!) 130  Resp: 20 (!) 21  (!) 21  Temp:   99 F (37.2 C)   TempSrc:   Axillary   SpO2: 95% 97%  95%  Weight:      Height:        General - Elderly obese Caucasian male, no apparent distress HEENT - PERRLA, EOMI, atraumatic head, non tender sinuses. Lung - Clear, basal rales,  rhonchi, wheezes. Heart - S1, S2 heard, no murmurs, rubs, trace pedal edema. Abdomen - Soft, non tender, bowel sounds good Neuro - Alert, awake and oriented x 3, non focal exam. Skin - Warm and dry. Hand tremors improved.  Data Reviewed:      Latest Ref Rng & Units 07/26/2024    3:23 AM 07/25/2024    3:29 AM 07/24/2024    8:09 AM  CBC  WBC  4.0 - 10.5 K/uL 5.2  3.9  4.6   Hemoglobin 13.0 - 17.0 g/dL 84.6  84.5  83.6   Hematocrit 39.0 - 52.0 % 45.6  46.4  48.4   Platelets 150 - 400 K/uL 88  94  146       Latest Ref Rng & Units 07/26/2024    3:23 AM 07/25/2024    3:29 AM 07/24/2024    8:09 AM  BMP  Glucose 70 - 99 mg/dL 815  749  641   BUN 6 - 20 mg/dL 9  8  6    Creatinine 0.61 - 1.24 mg/dL 9.64  9.40  9.45   Sodium 135 - 145 mmol/L 136  133  134   Potassium 3.5 - 5.1 mmol/L 2.9  3.4  3.3   Chloride 98 - 111 mmol/L 97  96  93   CO2 22 - 32 mmol/L 23  25  26    Calcium 8.9 - 10.3 mg/dL 8.7  8.5  8.9    MR BRAIN WO CONTRAST Result Date: 07/24/2024 CLINICAL DATA:  Provided history: Abnormal head CT. EXAM: MRI HEAD WITHOUT CONTRAST TECHNIQUE: Multiplanar, multiecho pulse sequences of the brain and surrounding structures were obtained without intravenous contrast. COMPARISON:  Non-contrast head CT and CT angiogram head/neck performed earlier today 07/24/2024. FINDINGS: Brain: No age-advanced or lobar predominant cerebral atrophy. No cortical encephalomalacia is identified. No significant cerebral white matter disease. There is no acute infarct. No evidence of an intracranial mass. No chronic intracranial blood products. No extra-axial fluid collection. No midline shift. Vascular: Please see CTA head/neck performed earlier today. Skull and upper cervical spine: No focal worrisome marrow lesion. Sinuses/Orbits: No mass or acute finding within the imaged orbits. No significant paranasal sinus disease. IMPRESSION: Unremarkable non-contrast MRI appearance of the brain. No evidence of an acute intracranial abnormality. Electronically Signed   By: Rockey Childs D.O.   On: 07/24/2024 18:43   Family Communication: Discussed with patient, he understand and agree. All questions answered.  Disposition: Status is: Inpatient Remains inpatient appropriate because: alcohol withdrawal, electrolyte replacement.  Planned Discharge Destination:  Home     Time spent: 46 minutes  Author: Concepcion Riser, MD 07/26/2024 11:43 AM Secure chat 7am to 7pm For on call review [email or web address].

## 2024-07-26 NOTE — Telephone Encounter (Signed)
 Pharmacy Patient Advocate Encounter   Received notification from Inpatient Request that prior authorization for Dexcom G7 Sensor is required/requested.   Insurance verification completed.   The patient is insured through [facility] .   Per test claim: PA required; PA submitted to above mentioned insurance via CoverMyMeds Key/confirmation #/EOC [id number] Status is pending

## 2024-07-26 NOTE — Inpatient Diabetes Management (Signed)
 Inpatient Diabetes Program Recommendations  AACE/ADA: New Consensus Statement on Inpatient Glycemic Control (2015)  Target Ranges:  Prepandial:   less than 140 mg/dL      Peak postprandial:   less than 180 mg/dL (1-2 hours)      Critically ill patients:  140 - 180 mg/dL   Lab Results  Component Value Date   GLUCAP 208 (H) 07/26/2024   HGBA1C 9.3 (H) 07/24/2024    Review of Glycemic Control  Diabetes history: New-onset DM Outpatient Diabetes medications: N/A Current orders for Inpatient glycemic control: Semglee  20 units at bedtime, Novolog  0-20 TID with meals and 0-5 HS + 4 units TID with meals  HgbA1C - 9.3%  Inpatient Diabetes Program Recommendations:    Increase Semglee  to 22 units at bedtime  Increase Novolog  to 5 units TID with meals if eating > 50%  Educated patient on insulin  pen use at home. Reviewed contents of insulin  flexpen starter kit. Reviewed all steps if insulin  pen including attachment of needle, 2-unit air shot, dialing up dose, giving injection, removing needle, disposal of sharps, storage of unused insulin , disposal of insulin  etc. Patient able to provide successful return demonstration. Also reviewed troubleshooting with insulin  pen. MD to give patient Rxs for insulin  pens and insulin  pen needles.  Discussed hypoglycemia s/s and treatment once again. Discussed CGMs. Gave info on Dexcom and pt interested in trying it out. Will place prior to discharge if MD agrees.  Appreciate TOC assisting pt with obtaining PCP appt on 08/17/24 with Dr Jerrell.  Will continue to follow.  Thank you. Shona Brandy, RD, LDN, CDCES Inpatient Diabetes Coordinator [phone]

## 2024-07-26 NOTE — Progress Notes (Signed)
   07/26/24 1559  TOC Brief Assessment  Insurance and Status Reviewed ([employer] / [employer] OTHER)  Patient has primary care physician Yes (Will have first appt with Dr. Jerrell 8/22 at 9:40am)  Home environment has been reviewed Single family home  Prior level of function: Independent with ADL's  Prior/Current Home Services No current home services  Social Drivers of Health Review SDOH reviewed no interventions necessary  Readmission risk has been reviewed Yes  Transition of care needs no transition of care needs at this time   NCM met with pt to discuss substance use resources. Pt states he would like outpatient resources. Resources added to DC summary. TOC consulted for PCP needs. NCM set up PCP follow-up appt with Cleatus Jerrell, MD on 08/17/24 at 9:40am. There are no other TOC needs at this time.

## 2024-07-26 NOTE — Telephone Encounter (Signed)
 Pharmacy Patient Advocate Encounter  Received notification from [employer] that Prior Authorization for Dexcom G7 Sensor  has been APPROVED from 07/26/2024 to 07/26/2025. Ran test claim, Copay is $75.00. This test claim was processed through [facility]- copay amounts may vary at other pharmacies due to pharmacy/plan contracts, or as the patient moves through the different stages of their insurance plan.   PA #/Case ID/Reference #: [id number]

## 2024-07-26 NOTE — Telephone Encounter (Signed)
 Patient Product/process development scientist completed.    The patient is insured through [facility]. Patient has [employer], may use a copay card, and/or apply for patient assistance if available.    Ran test claim for Lantus  Pen and the current 30 day co-pay is $0.00.  Ran test claim for Novolog  FlexPen and the current 30 day co-pay is $0.00.  Ran test claim for Dexcom G7 Sensor and Requires Prior Authorization  Ran test claim for [employer] 3 Plus Sensor and Product Not Covered  This test claim was processed through [employer]- copay amounts may vary at other pharmacies due to [employer], or as the patient moves through the different stages of their insurance plan.     Reyes Sharps, CPHT Pharmacy Technician III Certified Patient Advocate [facility] Pharmacy Patient Advocate Team Direct Number: [phone]  Fax: [phone]

## 2024-07-27 DIAGNOSIS — F10232 Alcohol dependence with withdrawal with perceptual disturbance: Secondary | ICD-10-CM | POA: Diagnosis not present

## 2024-07-27 LAB — CBC
HCT: 44.2 % (ref 39.0–52.0)
Hemoglobin: 14.6 g/dL (ref 13.0–17.0)
MCH: 33.1 pg (ref 26.0–34.0)
MCHC: 33 g/dL (ref 30.0–36.0)
MCV: 100.2 fL — ABNORMAL HIGH (ref 80.0–100.0)
Platelets: 85 K/uL — ABNORMAL LOW (ref 150–400)
RBC: 4.41 MIL/uL (ref 4.22–5.81)
RDW: 12 % (ref 11.5–15.5)
WBC: 5.2 K/uL (ref 4.0–10.5)
nRBC: 0 % (ref 0.0–0.2)

## 2024-07-27 LAB — BASIC METABOLIC PANEL WITH GFR
Anion gap: 11 (ref 5–15)
BUN: 11 mg/dL (ref 6–20)
CO2: 22 mmol/L (ref 22–32)
Calcium: 8.6 mg/dL — ABNORMAL LOW (ref 8.9–10.3)
Chloride: 100 mmol/L (ref 98–111)
Creatinine, Ser: 0.53 mg/dL — ABNORMAL LOW (ref 0.61–1.24)
GFR, Estimated: >60 mL/min (ref >60)
Glucose, Bld: 150 mg/dL — ABNORMAL HIGH (ref 70–99)
Potassium: 3 mmol/L — ABNORMAL LOW (ref 3.5–5.1)
Sodium: 133 mmol/L — ABNORMAL LOW (ref 135–145)

## 2024-07-27 LAB — MAGNESIUM: Magnesium: 1.4 mg/dL — ABNORMAL LOW (ref 1.7–2.4)

## 2024-07-27 LAB — GLUCOSE, CAPILLARY
Glucose-Capillary: 150 mg/dL — ABNORMAL HIGH (ref 70–99)
Glucose-Capillary: 208 mg/dL — ABNORMAL HIGH (ref 70–99)
Glucose-Capillary: 163 mg/dL — ABNORMAL HIGH (ref 70–99)
Glucose-Capillary: 161 mg/dL — ABNORMAL HIGH (ref 70–99)

## 2024-07-27 LAB — PHOSPHORUS: Phosphorus: 4.5 mg/dL (ref 2.5–4.6)

## 2024-07-27 MED ORDER — POTASSIUM CHLORIDE CRYS ER 20 MEQ PO TBCR
40.0000 meq | EXTENDED_RELEASE_TABLET | Freq: Once | ORAL | Status: AC
  Administered 2024-07-27: 40 meq via ORAL
  Filled 2024-07-27: qty 2

## 2024-07-27 MED ORDER — LISINOPRIL 20 MG PO TABS
20.0000 mg | ORAL_TABLET | Freq: Every day | ORAL | Status: DC
  Administered 2024-07-27 – 2024-07-28 (×2): 20 mg via ORAL
  Filled 2024-07-27: qty 1
  Filled 2024-07-27: qty 2

## 2024-07-27 MED ORDER — MAGNESIUM SULFATE 4 GM/100ML IV SOLN
4.0000 g | Freq: Once | INTRAVENOUS | Status: AC
  Administered 2024-07-27: 4 g via INTRAVENOUS
  Filled 2024-07-27: qty 100

## 2024-07-27 MED ORDER — POTASSIUM CHLORIDE IN NACL 40-0.9 MEQ/L-% IV SOLN
INTRAVENOUS | Status: AC
  Filled 2024-07-27 (×2): qty 1000

## 2024-07-27 MED ORDER — MAGNESIUM SULFATE 2 GM/50ML IV SOLN: 2.0000 g | Freq: Once | INTRAVENOUS | Status: DC

## 2024-07-27 NOTE — Plan of Care (Signed)
  Problem: Coping: Goal: Ability to adjust to condition or change in health will improve Outcome: Progressing   Problem: Nutritional: Goal: Maintenance of adequate nutrition will improve Outcome: Progressing   Problem: Education: Goal: Knowledge of General Education information will improve Description: Including pain rating scale, medication(s)/side effects and non-pharmacologic comfort measures Outcome: Progressing   Problem: Activity: Goal: Risk for activity intolerance will decrease Outcome: Progressing   Problem: Nutrition: Goal: Adequate nutrition will be maintained Outcome: Progressing   Problem: Coping: Goal: Level of anxiety will decrease Outcome: Progressing   Problem: Elimination: Goal: Will not experience complications related to bowel motility Outcome: Progressing Goal: Will not experience complications related to urinary retention Outcome: Progressing   Problem: Pain Managment: Goal: General experience of comfort will improve and/or be controlled Outcome: Progressing   Problem: Safety: Goal: Ability to remain free from injury will improve Outcome: Progressing   Problem: Skin Integrity: Goal: Risk for impaired skin integrity will decrease Outcome: Progressing

## 2024-07-27 NOTE — Progress Notes (Signed)
 Progress Note   Patient: Luis Richards FMW:[id number] DOB: [birth year] DOA: 07/24/2024     3 DOS: the patient was seen and examined on 07/27/2024   Brief hospital course: Luis Richards is a 33 y.o. male with medical history significant of class III obesity, vitamin D deficiency, hepatic steatosis, alcohol dependence (has been drinking about half a gallon a day liquor), history of alcoholic pancreatitis, tobacco use, history of alcohol withdrawal who presented to the emergency department complaints of headache, dizziness and tinnitus after hitting his head 3 days ago when he passed out.  The patient also sustained a laceration over his lower lip which is not bleeding.  He also saw blood in his stools earlier today.  He stated that he has not had alcohol in the last 2 days.    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  Patient is started on IV fluid bolus, placed on CIWA protocol, electrolytes replaced, admitted to TRH service for further management evaluation  Assessment and Plan: Alcohol withdrawal with fall prior to this hospitalization  - IV NS w/ 40K+ @ 75 cc/hr  - Ativan  PRN  - Phenobarbital  16.2 mg PO bid and 32.4 mg PO bid  - Mag - Ox 400 mg PO daily  - Folic acid  1 mg PO daily  - Thiamine  100 mg PO daily  - MVI PO daily  - PT/OT ordered 07/27/2024  HypoNa+  - IV fluids as above   HypoK+ - K-dur 40 meq PO x 2   DM2  - Novolog  SS ACHS  - Novolog  4 units sq tid  - Semglee  20 units sq at bedtime   Morbid obesity  - BMI 45.61 kg/m2  - Complicating care   6. HTN  - Lopressor  25 mg PO bid  - Lisinopril 20 mg PO daily   Subjective: Pt seen and examined at the bedside. Electrolytes being aggressively replaced today. Continue with IV fluids. PT/OT due to the fall prior to the  hospitalization.  Physical Exam: Vitals:   07/27/24 0907 07/27/24 1134 07/27/24 1200 07/27/24 1240  BP: (!) 149/85 (!) 152/89    Pulse: 86 88 87   Resp: (!) 22 (!) 22 16   Temp:    97.8 F (36.6 C)  TempSrc:    Oral  SpO2: 99% 93% 93%   Weight:      Height:       Physical Exam HENT:     Head: Normocephalic.     Mouth/Throat:     Mouth: Mucous membranes are moist.  Cardiovascular:     Rate and Rhythm: Normal rate.  Pulmonary:     Effort: Pulmonary effort is normal.  Abdominal:     Palpations: Abdomen is soft.  Musculoskeletal:        General: Normal range of motion.  Skin:    General: Skin is warm.  Neurological:     Mental Status: He is alert. Mental status is at baseline.  Psychiatric:        Mood and Affect: Mood normal.      Disposition: Status is: Inpatient Remains inpatient appropriate because: IV fluids and electrolyte replacement. Serial labs  Planned Discharge Destination: Barriers to discharge: As above     Time spent: 35 minutes  Author: Zamaya Rapaport , MD 07/27/2024 12:44 PM  For on call review [email or web address].

## 2024-07-28 ENCOUNTER — Other Ambulatory Visit (HOSPITAL_COMMUNITY): Payer: Self-pay

## 2024-07-28 DIAGNOSIS — F10232 Alcohol dependence with withdrawal with perceptual disturbance: Secondary | ICD-10-CM | POA: Diagnosis not present

## 2024-07-28 LAB — CBC
HCT: 46.4 % (ref 39.0–52.0)
Hemoglobin: 14.7 g/dL (ref 13.0–17.0)
MCH: 32.7 pg (ref 26.0–34.0)
MCHC: 31.7 g/dL (ref 30.0–36.0)
MCV: 103.1 fL — ABNORMAL HIGH (ref 80.0–100.0)
Platelets: 112 K/uL — ABNORMAL LOW (ref 150–400)
RBC: 4.5 MIL/uL (ref 4.22–5.81)
RDW: 12.2 % (ref 11.5–15.5)
WBC: 6.2 K/uL (ref 4.0–10.5)
nRBC: 0 % (ref 0.0–0.2)

## 2024-07-28 LAB — PROTIME-INR
INR: 1 (ref 0.8–1.2)
Prothrombin Time: 13.9 s (ref 11.4–15.2)

## 2024-07-28 LAB — GLUCOSE, CAPILLARY
Glucose-Capillary: 152 mg/dL — ABNORMAL HIGH (ref 70–99)
Glucose-Capillary: 113 mg/dL — ABNORMAL HIGH (ref 70–99)
Glucose-Capillary: 138 mg/dL — ABNORMAL HIGH (ref 70–99)

## 2024-07-28 LAB — BASIC METABOLIC PANEL WITH GFR
Anion gap: 5 (ref 5–15)
BUN: 8 mg/dL (ref 6–20)
CO2: 23 mmol/L (ref 22–32)
Calcium: 9 mg/dL (ref 8.9–10.3)
Chloride: 105 mmol/L (ref 98–111)
Creatinine, Ser: 0.57 mg/dL — ABNORMAL LOW (ref 0.61–1.24)
GFR, Estimated: >60 mL/min (ref >60)
Glucose, Bld: 150 mg/dL — ABNORMAL HIGH (ref 70–99)
Potassium: 4.7 mmol/L (ref 3.5–5.1)
Sodium: 133 mmol/L — ABNORMAL LOW (ref 135–145)

## 2024-07-28 LAB — PHOSPHORUS: Phosphorus: 3.5 mg/dL (ref 2.5–4.6)

## 2024-07-28 LAB — HEPATIC FUNCTION PANEL
ALT: 119 U/L — ABNORMAL HIGH (ref 0–44)
AST: 119 U/L — ABNORMAL HIGH (ref 15–41)
Albumin: 3.8 g/dL (ref 3.5–5.0)
Alkaline Phosphatase: 70 U/L (ref 38–126)
Bilirubin, Direct: 0.9 mg/dL — ABNORMAL HIGH (ref 0.0–0.2)
Indirect Bilirubin: 1.8 mg/dL — ABNORMAL HIGH (ref 0.3–0.9)
Total Bilirubin: 2.7 mg/dL — ABNORMAL HIGH (ref 0.0–1.2)
Total Protein: 7.5 g/dL (ref 6.5–8.1)

## 2024-07-28 LAB — MAGNESIUM: Magnesium: 2.2 mg/dL (ref 1.7–2.4)

## 2024-07-28 LAB — AMMONIA: Ammonia: 37 umol/L — ABNORMAL HIGH (ref 9–35)

## 2024-07-28 MED ORDER — VITAMIN B-1 100 MG PO TABS
100.0000 mg | ORAL_TABLET | Freq: Every day | ORAL | 0 refills | Status: AC
  Filled 2024-07-28: qty 30, 30d supply, fill #0

## 2024-07-28 MED ORDER — ADULT MULTIVITAMIN W/MINERALS CH
1.0000 | ORAL_TABLET | Freq: Every day | ORAL | 0 refills | Status: AC
  Filled 2024-07-28: qty 30, 30d supply, fill #0

## 2024-07-28 MED ORDER — BLOOD GLUCOSE TEST VI STRP
1.0000 | ORAL_STRIP | Freq: Three times a day (TID) | 0 refills | Status: AC
  Filled 2024-07-28 (×2): qty 100, 34d supply, fill #0

## 2024-07-28 MED ORDER — LISINOPRIL 20 MG PO TABS
20.0000 mg | ORAL_TABLET | Freq: Every day | ORAL | 0 refills | Status: AC
  Filled 2024-07-28: qty 30, 30d supply, fill #0

## 2024-07-28 MED ORDER — ACCU-CHEK FASTCLIX LANCET KIT
1.0000 | PACK | Freq: Three times a day (TID) | 0 refills | Status: AC
  Filled 2024-07-28: qty 1, 30d supply, fill #0

## 2024-07-28 MED ORDER — BLOOD GLUCOSE MONITOR SYSTEM W/DEVICE KIT
1.0000 | PACK | Freq: Three times a day (TID) | 0 refills | Status: AC
Start: 2024-07-28 — End: ?
  Filled 2024-07-28: qty 1, 30d supply, fill #0

## 2024-07-28 MED ORDER — INSULIN ASPART 100 UNIT/ML FLEXPEN
5.0000 [IU] | PEN_INJECTOR | Freq: Three times a day (TID) | SUBCUTANEOUS | 0 refills | Status: AC
  Filled 2024-07-28: qty 15, 42d supply, fill #0

## 2024-07-28 MED ORDER — ACCU-CHEK SOFTCLIX LANCETS MISC
1.0000 | Freq: Three times a day (TID) | 0 refills | Status: AC
  Filled 2024-07-28: qty 100, 33d supply, fill #0

## 2024-07-28 MED ORDER — FOLIC ACID 1 MG PO TABS
1.0000 mg | ORAL_TABLET | Freq: Every day | ORAL | 0 refills | Status: AC
  Filled 2024-07-28: qty 30, 30d supply, fill #0

## 2024-07-28 MED ORDER — PEN NEEDLES 31G X 8 MM MISC
1.0000 | Freq: Three times a day (TID) | 0 refills | Status: AC
Start: 2024-07-28 — End: ?
  Filled 2024-07-28: qty 100, 33d supply, fill #0

## 2024-07-28 MED ORDER — INSULIN GLARGINE 100 UNIT/ML SOLOSTAR PEN
22.0000 [IU] | PEN_INJECTOR | Freq: Every day | SUBCUTANEOUS | 0 refills | Status: AC
  Filled 2024-07-28: qty 15, 68d supply, fill #0

## 2024-07-28 NOTE — Plan of Care (Signed)
  Problem: Education: Goal: Ability to describe self-care measures that may prevent or decrease complications (Diabetes Survival Skills Education) will improve Outcome: Progressing Goal: Individualized Educational Video(s) Outcome: Progressing   Problem: Coping: Goal: Ability to adjust to condition or change in health will improve Outcome: Progressing   Problem: Fluid Volume: Goal: Ability to maintain a balanced intake and output will improve Outcome: Progressing   Problem: Health Behavior/Discharge Planning: Goal: Ability to identify and utilize available resources and services will improve Outcome: Progressing   Problem: Metabolic: Goal: Ability to maintain appropriate glucose levels will improve Outcome: Progressing   Problem: Nutritional: Goal: Maintenance of adequate nutrition will improve Outcome: Progressing Goal: Progress toward achieving an optimal weight will improve Outcome: Progressing   Problem: Skin Integrity: Goal: Risk for impaired skin integrity will decrease Outcome: Progressing   Problem: Tissue Perfusion: Goal: Adequacy of tissue perfusion will improve Outcome: Progressing   Problem: Education: Goal: Knowledge of General Education information will improve Description: Including pain rating scale, medication(s)/side effects and non-pharmacologic comfort measures Outcome: Progressing   Problem: Health Behavior/Discharge Planning: Goal: Ability to manage health-related needs will improve Outcome: Progressing   Problem: Clinical Measurements: Goal: Ability to maintain clinical measurements within normal limits will improve Outcome: Progressing Goal: Will remain free from infection Outcome: Progressing Goal: Diagnostic test results will improve Outcome: Progressing Goal: Respiratory complications will improve Outcome: Progressing Goal: Cardiovascular complication will be avoided Outcome: Progressing   Problem: Activity: Goal: Risk for activity  intolerance will decrease Outcome: Progressing   Problem: Nutrition: Goal: Adequate nutrition will be maintained Outcome: Progressing   Problem: Elimination: Goal: Will not experience complications related to bowel motility Outcome: Progressing Goal: Will not experience complications related to urinary retention Outcome: Progressing   Problem: Pain Managment: Goal: General experience of comfort will improve and/or be controlled Outcome: Progressing   Problem: Safety: Goal: Ability to remain free from injury will improve Outcome: Progressing   Problem: Skin Integrity: Goal: Risk for impaired skin integrity will decrease Outcome: Progressing   Problem: Health Behavior/Discharge Planning: Goal: Ability to manage health-related needs will improve Outcome: Not Progressing   Problem: Coping: Goal: Level of anxiety will decrease Outcome: Not Progressing

## 2024-07-28 NOTE — Consult Note (Signed)
 Referring Provider: [facility] hospitalist Primary Care Physician:  Patient, No Pcp Per Primary Gastroenterologist: Sampson   Reason for Consultation: Abnormal LFTs  HPI: Luis Richards is a 33 y.o. male with past medical history of alcohol dependence, history of alcohol induced pancreatitis, fatty liver, obesity and vitamin D deficiency presented to the hospital with syncope and fall.  Patient was found to be in alcohol withdrawal.  Treated with Ativan  and phenobarbital .  Was also found to have elevated LFTs.  GI is consulted for further evaluation.  CT abdomen pelvis with IV contrast on admission showed fatty liver otherwise no acute changes.  Patient seen and examined at bedside.  Besides occasional diarrhea he denies any other GI symptoms.  No known family history of liver disease.  Past Medical History:  Diagnosis Date   Alcohol abuse    Fatty liver    Morbid obesity (HCC)    Vitamin D deficiency     Past Surgical History:  Procedure Laterality Date   APPENDECTOMY      Prior to Admission medications   Medication Sig Start Date End Date Taking? Authorizing Provider  Aspirin -Caffeine 845-65 MG PACK Take 1 Package by mouth every 6 (six) hours as needed (Pain).   Yes [provider]    Scheduled Meds:  Chlorhexidine  Gluconate Cloth  6 each Topical Daily   folic acid   1 mg Oral Daily   insulin  aspart  0-20 Units Subcutaneous TID WC   insulin  aspart  0-5 Units Subcutaneous QHS   insulin  aspart  4 Units Subcutaneous TID WC   insulin  glargine-yfgn  20 Units Subcutaneous QHS   lisinopril   20 mg Oral Daily   LORazepam   0-4 mg Oral Q8H   metoprolol  tartrate  25 mg Oral BID   multivitamin with minerals  1 tablet Oral Daily   phenobarbital   16.2 mg Oral BID   thiamine   100 mg Oral Daily   Or   thiamine   100 mg Intravenous Daily   Continuous Infusions: PRN Meds:.acetaminophen  **OR** acetaminophen , hydrALAZINE , labetalol , LORazepam , melatonin, mouth rinse,  prochlorperazine   Allergies as of 07/24/2024   (No Known Allergies)    Family History  Problem Relation Age of Onset   Diabetes Father    Diabetes Paternal Aunt    Diabetes Paternal Uncle     Social History   Socioeconomic History   Marital status: Single    Spouse name: Not on file   Number of children: Not on file   Years of education: Not on file   Highest education level: Not on file  Occupational History   Not on file  Tobacco Use   Smoking status: Former   Smokeless tobacco: Never  Vaping Use   Vaping status: Never Used  Substance and Sexual Activity   Alcohol use: Yes    Comment: 12+shots a day   Drug use: No   Sexual activity: Not Currently  Other Topics Concern   Not on file  Social History Narrative   ** Merged History Encounter **       Social Drivers of Health   Financial Resource Strain: Not on file  Food Insecurity: No Food Insecurity (07/24/2024)   Hunger Vital Sign    Worried About Running Out of Food in the Last Year: Never true    Ran Out of Food in the Last Year: Never true  Transportation Needs: No Transportation Needs (07/24/2024)   PRAPARE - Administrator, Civil Service (Medical): No    Lack of Transportation (  Non-Medical): No  Physical Activity: Not on file  Stress: Not on file  Social Connections: Not on file  Intimate Partner Violence: Not At Risk (07/24/2024)   Humiliation, Afraid, Rape, and Kick questionnaire    Fear of Current or Ex-Partner: No    Emotionally Abused: No    Physically Abused: No    Sexually Abused: No    Review of Systems: All negative except as stated above in HPI.  Physical Exam: Vital signs: Vitals:   07/28/24 0912 07/28/24 1240  BP: 110/60 126/86  Pulse: 90 88  Resp:  19  Temp:  97.9 F (36.6 C)  SpO2:  97%   Last BM Date : 07/26/24 General: Morbidly obese, sitting comfortably, not in acute distress HEENT -normocephalic, atraumatic, extraocular movement intact, no scleral  icterus Lungs:  Clear throughout to auscultation.   No wheezes, crackles, or rhonchi. No acute distress. Heart:  Regular rate and rhythm; no murmurs, clicks, rubs,  or gallops. Abdomen: Soft, nontender, nondistended, bowel sounds present, no peritoneal signs Mood and affect normal Alert and oriented x 3 Rectal:  Deferred  GI:  Lab Results: Recent Labs    07/26/24 0323 07/27/24 0332 07/28/24 0652  WBC 5.2 5.2 6.2  HGB 15.3 14.6 14.7  HCT 45.6 44.2 46.4  PLT 88* 85* 112*   BMET Recent Labs    07/26/24 0323 07/27/24 0332 07/28/24 0652  NA 136 133* 133*  K 2.9* 3.0* 4.7  CL 97* 100 105  CO2 23 22 23   GLUCOSE 184* 150* 150*  BUN 9 11 8   CREATININE 0.35* 0.53* 0.57*  CALCIUM 8.7* 8.6* 9.0   LFT Recent Labs    07/28/24 0652  PROT 7.5  ALBUMIN 3.8  AST 119*  ALT 119*  ALKPHOS 70  BILITOT 2.7*  BILIDIR 0.9*  IBILI 1.8*   PT/INR No results for input(s): LABPROT, INR in the last 72 hours.   Studies/Results: No results found.  Impression/Plan: - Abnormal LFTs with mildly elevated AST ALT and T. bili of 2.7.  Abnormal LFTs since 2021.  Likely from underlying alcohol use. - Elevated T. bili of 2.7 with indirect bili of 1.8.  Likely indirect hyperbilirubinemia. - Alcohol dependence/alcohol abuse with alcohol withdrawal - Fatty liver - Morbid obesity with BMI of 45  Recommendations ------------------------- - Patient with chronic and mild elevated LFTs likely from alcohol use and underlying fatty liver.  Patient has decreased platelet count likely from underlying alcohol use but normal albumin.  No INR on file. - Recommend to check INR  - Recommend absolute alcohol abstinence.  Recommend diet and exercise for obesity. - Recommend outpatient workup of his abnormal LFTs.  No further inpatient GI workup planned.  GI will sign off.  Call us  back if needed.    LOS: 4 days   Layla Lah  MD, FACP 07/28/2024, 12:49 PM  Contact #  [phone]

## 2024-07-28 NOTE — Evaluation (Signed)
 Occupational Therapy Evaluation Patient Details Name: Luis Richards MRN: [id number] DOB: Apr 07, 1991 Today's Date: 07/28/2024   History of Present Illness   Luis Richards is a 33 y.o. male with medical history significant of class III obesity, vitamin D deficiency, hepatic steatosis, alcohol dependence (has been drinking about half a gallon a day liquor), history of alcoholic pancreatitis, tobacco use, history of alcohol withdrawal who presented to the emergency department complaints of headache, dizziness and tinnitus after hitting his head 3 days ago when he passed out also sustained a laceration over his lower lip. He also saw blood in his stools earlier today. He stated that he has not had alcohol in the last 2 days. 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     Clinical Impressions PTA, pt was living alone and independent with all ADL/IADL and functional mobility. Pt demonstrated ability to ambulate independently, complete ADL independently and reports he is at his baseline level. Communicated pt's current status to PT. Patient evaluated by Occupational Therapy with no further acute OT needs identified. All education has been completed and the patient has no further questions. See below for any follow-up Occupational Therapy or equipment needs. OT to sign off. Thank you for referral.       If plan is discharge home, recommend the following:   Other (comment) (assist as needed)     Functional Status Assessment   Patient has not had a recent decline in their functional status     Equipment Recommendations   None recommended by OT     Recommendations for Other Services         Precautions/Restrictions   Precautions Precautions: Fall Restrictions Weight Bearing Restrictions  Per Provider Order: No     Mobility Bed Mobility Overal bed mobility: Independent                  Transfers Overall transfer level: Independent Equipment used: None               General transfer comment: no instability noted this date      Balance Overall balance assessment: Independent                                         ADL either performed or assessed with clinical judgement   ADL Overall ADL's : At baseline;Independent                                       General ADL Comments: demonstrated ability to complete ADL independently     Vision Baseline Vision/History: 1 Wears glasses Ability to See in Adequate Light: 1 Impaired Patient Visual Report: No change from baseline Vision Assessment?: No apparent visual deficits     Perception         Praxis         Pertinent Vitals/Pain Pain Assessment Pain Assessment: No/denies pain     Extremity/Trunk Assessment Upper Extremity Assessment Upper Extremity Assessment: Overall WFL for tasks assessed   Lower Extremity Assessment Lower Extremity Assessment: Overall WFL for tasks assessed   Cervical / Trunk Assessment Cervical / Trunk Assessment: Normal   Communication  Cognition Arousal: Alert Behavior During Therapy: WFL for tasks assessed/performed Cognition: No apparent impairments                                       Cueing  General Comments      pt ambulated 125ft in the hallway good stability throughout   Exercises     Shoulder Instructions      Home Living Family/patient expects to be discharged to:: Private residence Living Arrangements: Alone Available Help at Discharge: Family;Available PRN/intermittently Type of Home: House Home Access: Stairs to enter [employer] of Steps: 1   Home Layout: One level     Bathroom Shower/Tub: Chief Strategy Officer: Standard     Home Equipment: None           Prior Functioning/Environment Prior Level of Function : Independent/Modified Independent             Mobility Comments: independent other than fall 3 days ago no additional falls ADLs Comments: independent    OT Problem List: Decreased activity tolerance   OT Treatment/Interventions:        OT Goals(Current goals can be found in the care plan section)   Acute Rehab OT Goals Patient Stated Goal: to go home OT Goal Formulation: With patient Time For Goal Achievement: 08/04/24 Potential to Achieve Goals: Good   OT Frequency:       Co-evaluation              AM-PAC OT 6 Clicks Daily Activity     Outcome Measure Help from another person eating meals?: None Help from another person taking care of personal grooming?: None Help from another person toileting, which includes using toliet, bedpan, or urinal?: None Help from another person bathing (including washing, rinsing, drying)?: None Help from another person to put on and taking off regular upper body clothing?: None Help from another person to put on and taking off regular lower body clothing?: None 6 Click Score: 24   End of Session Nurse Communication: Mobility status  Activity Tolerance: Patient tolerated treatment well Patient left: in chair;with call bell/phone within reach  OT Visit Diagnosis: Other abnormalities of gait and mobility (R26.89)                Time: 9156-9146 OT Time Calculation (min): 10 min Charges:  OT General Charges $OT Visit: 1 Visit OT Evaluation $OT Eval Low Complexity: 1 Low  Asuzena Weis OTR/L Acute Rehabilitation Services Office: [phone]   Verneita ONEIDA Moose 07/28/2024, 9:26 AM

## 2024-07-28 NOTE — Hospital Course (Signed)
 Luis Richards is a 33 y.o. male with a history of obesity, vitamin D deficiency, hepatic steatosis, alcohol dependence, alcoholic pancreatitis, tobacco use.  Patient presented secondary to headache, dizziness and tinnitus after history of fall and head injury. Workup was negative for acute process. During hospitalization, patient developed alcohol withdrawal, requiring Ativan  and phenobarbital  for management. Hospitalization also complicated by newly diagnosed diabetes. GI consulted prior to patient's discharge, and recommended outpatient GI follow-up. PCP follow-up also set up for patient prior to discharge.SABRA

## 2024-07-28 NOTE — Discharge Instructions (Signed)
 Luis Richards,  You were in the hospital because of alcohol withdrawal. This has been managed safely. While here, you were diagnosed with diabetes and have been started on insulin . Please take as prescribed and please ensure you follow-up with physicians as recommended as an outpatient. Also, please commit to COMPLETE stopping of alcohol use as your liver will continue to get more injury if you continue to drink alcohol; this can easily be fatal if your liver fails completely. We also discussed a nodule on your thyroid gland; please make sure that when you follow-up with your new primary care physician, this eventually gets addressed (thyroid ultrasound). It was a pleasure meeting you.

## 2024-07-28 NOTE — Progress Notes (Addendum)
 AVS reviewed with patient at the bedside. All questions answered, and patient verbalized understanding. IV removed per order without complications. Patient to be discharged home. Patient educated on how to self administer insulin . Medications delivered at bedside. Patient escorted to main entrance via WC by staff.

## 2024-07-28 NOTE — Discharge Summary (Signed)
 Physician Discharge Summary   Patient: Luis Richards MRN: [id number] DOB: August 06, 1991  Admit date:     07/24/2024  Discharge date: 07/28/24  Discharge Physician: Elgin Lam, MD   PCP: Patient, No Pcp Per   Recommendations at discharge:  PCP visit to establish care GI visit for hospital follow-up Thyroid ultrasound to assess thyroid nodule  Discharge Diagnoses: Principal Problem:   Alcohol dependence with withdrawal (HCC) Active Problems:   Alcoholic hepatitis   Class 3 obesity   Type 2 diabetes mellitus with hyperglycemia (HCC)   Thrombocytopenia (HCC)   Hyperbilirubinemia   Transaminitis   Pseudohyponatremia   Hypokalemia   Hypophosphatemia   Hypomagnesemia   LOC (loss of consciousness) (HCC)   Stenosis of cerebral artery   Prolonged QT interval  Resolved Problems:   * No resolved hospital problems. *  Hospital Course: Luis Richards is a 33 y.o. male with a history of obesity, vitamin D deficiency, hepatic steatosis, alcohol dependence, alcoholic pancreatitis, tobacco use.  Patient presented secondary to headache, dizziness and tinnitus after history of fall and head injury. Workup was negative for acute process. During hospitalization, patient developed alcohol withdrawal, requiring Ativan  and phenobarbital  for management. Hospitalization also complicated by newly diagnosed diabetes. GI consulted prior to patient's discharge, and recommended outpatient GI follow-up. PCP follow-up also set up for patient prior to discharge..  Assessment and Plan:  Alcohol withdrawal Patient presented with an alcohol level of 69. Patient started on CIWA protocol with Ativan  for management of symptoms. Patient transitioned to phenobarbital  with taper for continued management. Folic acid , thiamine  and multivitamin started. Withdrawal symptoms improved with treatment. Case management consulted for resources for alcohol abuse. GI consulted with recommendation for outpatient  follow-up  Elevated AST/ALT Hyperbilirubinemia In setting of fatty liver disease. Possibly some contribution by phenobarbital . GI consulted and recommended outpatient follow-up and complete alcohol cessation. INR of 1.0. Patient to follow-up with GI as an outpatient.  Fatty liver disease Likely secondary to alcohol use and/or obesity. GI consulted and recommend outpatient follow-up.  Hyponatremia Mild. In setting of liver disease. Stable.  Hypokalemia Resolved with potassium supplementation.  Diabetes mellitus, type 2 New diagnosis. Hemoglobin A1C of 9.3%. Patient started on insulin  therapy. Patient seen by the diabetic coordinator for recommendations and teaching. Patient discharged on insulin  glargine and insulin  aspart on discharge. Supplies provided as well.  Primary hypertension Continue lisinopril  on discharge.  Thyroid nodule Noted incidentally on imaging. Recommendation for outpatient thyroid ultrasound. Discussed with patient prior to discharge.  Cerebral artery stenosis Noted on CTA head/neck with severe stenosis involving the left posterior cerebral artery branch at the P2/P3 junction. Discussed with neurology who recommended no medications at this time since patient is asymptomatic; no need for outpatient follow-up. Recommendation for outpatient lipid panel and statin if indicated.  Obesity, class III Estimated body mass index is 45.61 kg/m as calculated from the following:   Height as of this encounter: 5' 8 (1.727 m).   Weight as of this encounter: 136.1 kg.   Consultants: [facility] Procedures performed: None  Disposition: Home Diet recommendation: Low sodium/carb modified   DISCHARGE MEDICATION: Allergies as of 07/28/2024   No Known Allergies      Medication List     STOP taking these medications    Aspirin -Caffeine 845-65 MG Pack       TAKE these medications    Accu-Chek FastClix Lancet Kit Use to test blood sugar 3 (three) times  daily.   Accu-Chek Guide Me w/Device Kit Use to test  blood sugar 3 (three) times daily.   Accu-Chek Guide Test test strip Generic drug: glucose blood Use to check blood sugar 3 (three) times daily.   Accu-Chek Softclix Lancets lancets Use to test blood suagr 3 (three) times daily.   CertaVite/Antioxidants Tabs Take 1 tablet by mouth daily. Start taking on: July 29, 2024   folic acid  1 MG tablet Commonly known as: FOLVITE  Take 1 tablet (1 mg total) by mouth daily. Start taking on: July 29, 2024   Insupen Pen Needles 31G X 8 MM Misc Generic drug: Insulin  Pen Needle Use to test blood sugar 3 (three) times daily.   Lantus  SoloStar 100 UNIT/ML Solostar Pen Generic drug: insulin  glargine Inject 22 Units into the skin daily. May substitute as needed per insurance.   lisinopril  20 MG tablet Commonly known as: ZESTRIL  Take 1 tablet (20 mg total) by mouth daily. Start taking on: July 29, 2024   NovoLOG  FlexPen 100 UNIT/ML FlexPen Generic drug: insulin  aspart Inject 5 Units into the skin 3 (three) times daily with meals. Only take if eating a meal AND Blood Glucose (BG) is 80 or higher.   thiamine  100 MG tablet Commonly known as: VITAMIN B1 Take 1 tablet (100 mg total) by mouth daily. Start taking on: July 29, 2024        Follow-up Information     Jerrell Cleatus Ned, MD Follow up on 08/17/2024.   Specialty: Internal Medicine Why: You have a hospital follow-up appt with Dr. Jerrell on 08/17/2024 at 9:40am. Please arrive 15 minutes prior to your appointment time to complete any necessary paperwork. Contact information: [address] [place] [address] [phone]         Elicia Claw, MD. Schedule an appointment as soon as possible for a visit in 2 month(s).   Specialty: Gastroenterology Why: Follow-up for abnormal LFTs Contact information: [address] [place] [address] [phone]                Discharge Exam: BP  126/86   Pulse 88   Temp 97.9 F (36.6 C)   Resp 19   Ht 5' 8 (1.727 m)   Wt 136.1 kg   SpO2 97%   BMI 45.61 kg/m   General exam: Appears calm and comfortable Respiratory system: Clear to auscultation. Respiratory effort normal. Cardiovascular system: S1 & S2 heard, RRR. No murmurs, rubs, gallops or clicks. Gastrointestinal system: Abdomen is obese, soft and nontender. Normal bowel sounds heard. Central nervous system: Alert and oriented. No focal neurological deficits. Psychiatry: Judgement and insight appear normal. Mood & affect appropriate.   Condition at discharge: stable  The results of significant diagnostics from this hospitalization (including imaging, microbiology, ancillary and laboratory) are listed below for reference.   Imaging Studies: MR BRAIN WO CONTRAST Result Date: 07/24/2024 CLINICAL DATA:  Provided history: Abnormal head CT. EXAM: MRI HEAD WITHOUT CONTRAST TECHNIQUE: Multiplanar, multiecho pulse sequences of the brain and surrounding structures were obtained without intravenous contrast. COMPARISON:  Non-contrast head CT and CT angiogram head/neck performed earlier today 07/24/2024. FINDINGS: Brain: No age-advanced or lobar predominant cerebral atrophy. No cortical encephalomalacia is identified. No significant cerebral white matter disease. There is no acute infarct. No evidence of an intracranial mass. No chronic intracranial blood products. No extra-axial fluid collection. No midline shift. Vascular: Please see CTA head/neck performed earlier today. Skull and upper cervical spine: No focal worrisome marrow lesion. Sinuses/Orbits: No mass or acute finding within the imaged orbits. No significant paranasal sinus  disease. IMPRESSION: Unremarkable non-contrast MRI appearance of the brain. No evidence of an acute intracranial abnormality. Electronically Signed   By: Rockey Childs D.O.   On: 07/24/2024 18:43   CT ABDOMEN PELVIS W CONTRAST Result Date: 07/24/2024 CLINICAL  DATA:  Epigastric pain EXAM: CT ABDOMEN AND PELVIS WITH CONTRAST TECHNIQUE: Multidetector CT imaging of the abdomen and pelvis was performed using the standard protocol following bolus administration of intravenous contrast. RADIATION DOSE REDUCTION: This exam was performed according to the departmental dose-optimization program which includes automated exposure control, adjustment of the mA and/or kV according to patient size and/or use of iterative reconstruction technique. CONTRAST:  OMNIPAQUE  IOHEXOL  350 MG/ML SOLN COMPARISON:  April 16, 2024 FINDINGS: Lower chest: No focal airspace consolidation or pleural effusion. Hepatobiliary: No mass.Diffuse hepatic steatosis.No radiopaque stones or wall thickening of the gallbladder. No intrahepatic or extrahepatic biliary ductal dilation. The portal veins are patent. Pancreas: No mass or main ductal dilation. No peripancreatic inflammation or fluid collection. Spleen: Normal size. No mass. Adrenals/Urinary Tract: No adrenal masses. No renal mass. No nephrolithiasis or hydronephrosis. The urinary bladder is distended without focal abnormality. Stomach/Bowel: 2.4 cm fat containing mass in the wall of the gastric cardia, likely a lipoma. The stomach is decompressed without focal abnormality. No small bowel wall thickening or inflammation. No small bowel obstruction.Appendectomy. Vascular/Lymphatic: No aortic aneurysm. No intraabdominal or pelvic lymphadenopathy. Reproductive: No prostatomegaly.No free pelvic fluid. Other: No pneumoperitoneum, ascites, or mesenteric inflammation. Musculoskeletal: No acute fracture or destructive lesion. Multilevel thoracic osteophytosis. IMPRESSION: 1. No acute intra-abdominal or pelvic abnormality. 2. Diffuse hepatic steatosis. Electronically Signed   By: Rogelia Myers M.D.   On: 07/24/2024 11:48   CT ANGIO HEAD NECK W WO CM Result Date: 07/24/2024 CLINICAL DATA:  Provided history: Head trauma, abnormal mental status. Additional  history provided: Headache, dizziness, tinnitus, recent syncope with head trauma, blurred vision. EXAM: CT ANGIOGRAPHY HEAD AND NECK WITH AND WITHOUT CONTRAST TECHNIQUE: Multidetector CT imaging of the head and neck was performed using the standard protocol during bolus administration of intravenous contrast. Multiplanar CT image reconstructions and MIPs were obtained to evaluate the vascular anatomy. Carotid stenosis measurements (when applicable) are obtained utilizing NASCET criteria, using the distal internal carotid diameter as the denominator. RADIATION DOSE REDUCTION: This exam was performed according to the departmental dose-optimization program which includes automated exposure control, adjustment of the mA and/or kV according to patient size and/or use of iterative reconstruction technique. CONTRAST:  OMNIPAQUE  IOHEXOL  350 MG/ML SOLN COMPARISON:  None. FINDINGS: CT HEAD FINDINGS Brain: No age-advanced or lobar predominant cerebral atrophy. There is no acute intracranial hemorrhage. No demarcated cortical infarct. No extra-axial fluid collection. No evidence of an intracranial mass. No midline shift. Vascular: No hyperdense vessel. Skull: No calvarial fracture or aggressive osseous lesion. Sinuses/Orbits: No orbital mass or acute orbital finding. No significant paranasal sinus disease. Review of the MIP images confirms the above findings CTA NECK FINDINGS Aortic arch: Standard aortic branching. Visible portions of the thoracic aorta are normal in caliber. Streak/beam hardening artifact arising from a dense contrast bolus partially obscures the right subclavian artery. Within this limitation, there is no appreciable hemodynamically significant innominate or proximal subclavian artery stenosis. Right carotid system: The very proximal common carotid artery is obscured by streak/beam hardening artifact. Within this limitation, the common carotid and internal carotid arteries are patent within the neck  without stenosis. Left carotid system: CCA and ICA patent within the neck without stenosis. Vertebral arteries: The left vertebral artery is non  dominant and developmentally diminutive, but patent throughout the neck. The right vertebral artery is patent within the neck without stenosis. Skeleton: No acute fracture or aggressive osseous lesion. Other neck: 12 mm left thyroid lobe nodule (for instance as seen on series 12, image 168). Upper chest: No consolidation within the imaged lung apices. Review of the MIP images confirms the above findings CTA HEAD FINDINGS Anterior circulation: The intracranial internal carotid arteries are patent. The M1 middle cerebral arteries are patent. No M2 proximal branch occlusion or high-grade proximal stenosis. The anterior cerebral arteries are patent. No intracranial aneurysm is identified. Posterior circulation: The intracranial vertebral arteries are patent. The basilar artery is patent. The posterior cerebral arteries are patent. Severe stenosis within a left PCA branch at the P2/P3 junction (series 16, image 55). Posterior communicating arteries are diminutive or absent, bilaterally. Venous sinuses: Within the limitations of contrast timing, no convincing thrombus. Anatomic variants: As described. Review of the MIP images confirms the above findings IMPRESSION: Non-contrast head CT: No evidence of an acute intracranial abnormality. CTA neck: 1. Streak/beam hardening artifact arising from a dense contrast bolus obscures the very proximal right common carotid artery. Within this limitation, the common carotid and internal carotid arteries are patent within the neck without stenosis. 2. The non-dominant left vertebral artery is developmentally diminutive, but patent throughout the neck. 3. The right vertebral artery is patent within the neck without stenosis. 4. 12 mm left thyroid lobe nodule. A non-emergent thyroid ultrasound is recommended for further evaluation. Reference: J  Am Coll Radiol. 2015 Feb;12(2): 143-50. CTA head: 1. No proximal intracranial large vessel occlusion identified. 2. Severe stenosis within a left posterior cerebral artery branch at the P2/P3 junction. Electronically Signed   By: Rockey Childs D.O.   On: 07/24/2024 11:03   DG Chest Portable 1 View Result Date: 07/24/2024 CLINICAL DATA:  Tachycardia. EXAM: PORTABLE CHEST 1 VIEW COMPARISON:  04/14/2024. FINDINGS: Low lung volume. Note is made of elevated right hemidiaphragm. Bilateral lung fields are clear. Bilateral costophrenic angles are clear. Normal cardio-mediastinal silhouette. No acute osseous abnormalities. The soft tissues are within normal limits. IMPRESSION: No active disease. Electronically Signed   By: Ree Molt M.D.   On: 07/24/2024 08:34    Microbiology: Results for orders placed or performed during the hospital encounter of 07/24/24  MRSA Next Gen by PCR, Nasal     Status: None   Collection Time: 07/24/24  6:47 PM   Specimen: Nasal Mucosa; Nasal Swab  Result Value Ref Range Status   MRSA by PCR Next Gen NOT DETECTED NOT DETECTED Final    Comment: (NOTE) The GeneXpert MRSA Assay (FDA approved for NASAL specimens only), is one component of a comprehensive MRSA colonization surveillance program. It is not intended to diagnose MRSA infection nor to guide or monitor treatment for MRSA infections. Test performance is not FDA approved in patients less than 9 years old. Performed at [facility], [address], [place], [place] [address]     Labs: CBC: Recent Labs  Lab 07/24/24 0809 07/25/24 0329 07/26/24 0323 07/27/24 0332 07/28/24 0652  WBC 4.6 3.9* 5.2 5.2 6.2  NEUTROABS 3.2  --   --   --   --   HGB 16.3 15.4 15.3 14.6 14.7  HCT 48.4 46.4 45.6 44.2 46.4  MCV 96.0 99.8 100.4* 100.2* 103.1*  PLT 146* 94* 88* 85* 112*   Basic Metabolic Panel: Recent Labs  Lab 07/24/24 0809 07/25/24 0329 07/25/24 1600 07/26/24 0323 07/27/24 9667  07/28/24 0652  NA 134* 133*  --  136 133* 133*  K 3.3* 3.4*  --  2.9* 3.0* 4.7  CL 93* 96*  --  97* 100 105  CO2 26 25  --  23 22 23   GLUCOSE 358* 250*  --  184* 150* 150*  BUN 6 8  --  9 11 8   CREATININE 0.54* 0.59*  --  0.35* 0.53* 0.57*  CALCIUM 8.9 8.5*  --  8.7* 8.6* 9.0  MG 1.3* 1.7  --  1.6* 1.4* 2.2  PHOS 2.3*  --  3.2 4.2 4.5 3.5   Liver Function Tests: Recent Labs  Lab 07/24/24 0809 07/25/24 0329 07/26/24 0323 07/28/24 0652  AST 147* 93* 72* 119*  ALT 117* 88* 80* 119*  ALKPHOS 82 75 67 70  BILITOT 1.6* 2.8* 2.2* 2.7*  PROT 8.0 7.4 7.3 7.5  ALBUMIN 4.2 3.7 3.7 3.8   CBG: Recent Labs  Lab 07/27/24 1605 07/27/24 2038 07/28/24 0721 07/28/24 1124 07/28/24 1633  GLUCAP 163* 161* 152* 113* 138*    Discharge time spent: 35 minutes.  Signed: Elgin Lam, MD [facility] 07/28/2024

## 2024-07-28 NOTE — Progress Notes (Signed)
 PT Cancellation Note  Patient Details Name: Luis Richards MRN: [id number] DOB: 10-28-1991   Cancelled Treatment:    Reason Eval/Treat Not Completed: PT screened, no needs identified, will sign off, ambulating Independently. Darice Potters PT Acute Rehabilitation Services Office [phone]    Potters Darice Norris 07/28/2024, 8:51 AM

## 2024-07-28 NOTE — TOC Progression Note (Signed)
 Transition of Care [facility]) - Progression Note    Patient Details  Name: Luis Richards MRN: [id number] Date of Birth: 1991/12/05  [facility] CM/SW Contact  Sonda Manuella Quill, RN Phone Number: 07/28/2024, 4:32 PM  Clinical Narrative:     Acknowledge consult for PCP and SA counseling/education; needs addressed on 07/27/24; no TOC needs.                    Expected Discharge Plan and Services                                               Social Drivers of Health (SDOH) Interventions SDOH Screenings   Food Insecurity: No Food Insecurity (07/24/2024)  Housing: Low Risk  (07/24/2024)  Transportation Needs: No Transportation Needs (07/24/2024)  Utilities: Not At Risk (07/24/2024)  Tobacco Use: Medium Risk (07/24/2024)    Readmission Risk Interventions    07/26/2024    3:59 PM  Readmission Risk Prevention Plan  Transportation Screening Complete  PCP or Specialist Appt within 5-7 Days Complete  Home Care Screening Complete  Medication Review (RN CM) Complete

## 2024-07-30 LAB — HEMOGLOBIN A1C
Hgb A1c MFr Bld: 9.5 % — ABNORMAL HIGH (ref 4.8–5.6)
Mean Plasma Glucose: 226 mg/dL

## 2024-08-17 ENCOUNTER — Ambulatory Visit: Admitting: Student in an Organized Health Care Education/Training Program
